# Patient Record
Sex: Female | Born: 1940
Health system: Southern US, Community
[De-identification: ages and names within clinical notes are randomized; demographics above are authoritative.]

## PROBLEM LIST (undated history)

## (undated) DIAGNOSIS — I10 Essential (primary) hypertension: Secondary | ICD-10-CM

## (undated) DIAGNOSIS — E119 Type 2 diabetes mellitus without complications: Secondary | ICD-10-CM

## (undated) DIAGNOSIS — K219 Gastro-esophageal reflux disease without esophagitis: Secondary | ICD-10-CM

## (undated) DIAGNOSIS — S82891A Other fracture of right lower leg, initial encounter for closed fracture: Secondary | ICD-10-CM

## (undated) DIAGNOSIS — E785 Hyperlipidemia, unspecified: Secondary | ICD-10-CM

## (undated) HISTORY — PX: CHOLECYSTECTOMY: SHX55

## (undated) HISTORY — PX: ABDOMINAL HYSTERECTOMY: SHX81

## (undated) HISTORY — PX: BREAST SURGERY: SHX581

## (undated) HISTORY — PX: COLONOSCOPY: SHX174

---

## 2016-04-10 ENCOUNTER — Other Ambulatory Visit: Payer: Self-pay | Admitting: Family Medicine

## 2016-04-10 DIAGNOSIS — Z1231 Encounter for screening mammogram for malignant neoplasm of breast: Secondary | ICD-10-CM

## 2016-05-25 ENCOUNTER — Ambulatory Visit (HOSPITAL_BASED_OUTPATIENT_CLINIC_OR_DEPARTMENT_OTHER)
Admission: RE | Admit: 2016-05-25 | Discharge: 2016-05-25 | Disposition: A | Discharge status: Home / Self Care | Payer: Medicare Other | Source: Ambulatory Visit | Attending: Family Medicine | Admitting: Family Medicine

## 2016-05-25 DIAGNOSIS — Z1231 Encounter for screening mammogram for malignant neoplasm of breast: Secondary | ICD-10-CM | POA: Diagnosis present

## 2017-04-23 ENCOUNTER — Other Ambulatory Visit: Payer: Self-pay | Admitting: Family Medicine

## 2017-04-23 DIAGNOSIS — Z1231 Encounter for screening mammogram for malignant neoplasm of breast: Secondary | ICD-10-CM

## 2017-05-31 ENCOUNTER — Ambulatory Visit (HOSPITAL_BASED_OUTPATIENT_CLINIC_OR_DEPARTMENT_OTHER)
Admission: RE | Admit: 2017-05-31 | Discharge: 2017-05-31 | Disposition: A | Discharge status: Home / Self Care | Payer: Medicare Other | Source: Ambulatory Visit | Attending: Family Medicine | Admitting: Family Medicine

## 2017-05-31 ENCOUNTER — Encounter (HOSPITAL_BASED_OUTPATIENT_CLINIC_OR_DEPARTMENT_OTHER): Payer: Self-pay | Admitting: Radiology

## 2017-05-31 DIAGNOSIS — Z1231 Encounter for screening mammogram for malignant neoplasm of breast: Secondary | ICD-10-CM | POA: Diagnosis present

## 2018-05-03 ENCOUNTER — Other Ambulatory Visit (HOSPITAL_BASED_OUTPATIENT_CLINIC_OR_DEPARTMENT_OTHER): Payer: Self-pay | Admitting: Physician Assistant

## 2018-05-03 DIAGNOSIS — Z1231 Encounter for screening mammogram for malignant neoplasm of breast: Secondary | ICD-10-CM

## 2018-06-02 ENCOUNTER — Ambulatory Visit (HOSPITAL_BASED_OUTPATIENT_CLINIC_OR_DEPARTMENT_OTHER): Payer: Medicare Other

## 2018-09-08 ENCOUNTER — Other Ambulatory Visit (HOSPITAL_BASED_OUTPATIENT_CLINIC_OR_DEPARTMENT_OTHER): Payer: Self-pay | Admitting: Family Medicine

## 2018-09-08 DIAGNOSIS — R3129 Other microscopic hematuria: Secondary | ICD-10-CM

## 2018-09-12 ENCOUNTER — Ambulatory Visit (HOSPITAL_BASED_OUTPATIENT_CLINIC_OR_DEPARTMENT_OTHER)
Admission: RE | Admit: 2018-09-12 | Discharge: 2018-09-12 | Disposition: A | Discharge status: Home / Self Care | Payer: Medicare Other | Source: Ambulatory Visit | Attending: Family Medicine | Admitting: Family Medicine

## 2018-09-12 ENCOUNTER — Other Ambulatory Visit: Payer: Self-pay

## 2018-09-12 DIAGNOSIS — R3129 Other microscopic hematuria: Secondary | ICD-10-CM | POA: Diagnosis present

## 2019-03-07 ENCOUNTER — Encounter (HOSPITAL_BASED_OUTPATIENT_CLINIC_OR_DEPARTMENT_OTHER): Payer: Self-pay | Admitting: Orthopaedic Surgery

## 2019-03-07 ENCOUNTER — Other Ambulatory Visit: Payer: Self-pay

## 2019-03-08 ENCOUNTER — Encounter (HOSPITAL_BASED_OUTPATIENT_CLINIC_OR_DEPARTMENT_OTHER)
Admission: RE | Admit: 2019-03-08 | Discharge: 2019-03-08 | Disposition: A | Discharge status: Home / Self Care | Payer: Medicare Other | Source: Ambulatory Visit | Attending: Orthopaedic Surgery | Admitting: Orthopaedic Surgery

## 2019-03-08 ENCOUNTER — Other Ambulatory Visit (HOSPITAL_COMMUNITY)
Admission: RE | Admit: 2019-03-08 | Discharge: 2019-03-08 | Disposition: A | Discharge status: Home / Self Care | Payer: Medicare Other | Source: Ambulatory Visit | Attending: Orthopaedic Surgery | Admitting: Orthopaedic Surgery

## 2019-03-08 DIAGNOSIS — Z01818 Encounter for other preprocedural examination: Secondary | ICD-10-CM | POA: Insufficient documentation

## 2019-03-08 DIAGNOSIS — Z20822 Contact with and (suspected) exposure to covid-19: Secondary | ICD-10-CM | POA: Diagnosis not present

## 2019-03-08 DIAGNOSIS — S82891B Other fracture of right lower leg, initial encounter for open fracture type I or II: Secondary | ICD-10-CM | POA: Diagnosis not present

## 2019-03-08 LAB — BASIC METABOLIC PANEL
Anion gap: 11 (ref 5–15)
BUN: 11 mg/dL (ref 8–23)
CO2: 29 mmol/L (ref 22–32)
Calcium: 9.3 mg/dL (ref 8.9–10.3)
Chloride: 91 mmol/L — ABNORMAL LOW (ref 98–111)
Creatinine, Ser: 0.79 mg/dL (ref 0.44–1.00)
GFR calc Af Amer: >60 mL/min (ref >60)
GFR calc non Af Amer: >60 mL/min (ref >60)
Glucose, Bld: 200 mg/dL — ABNORMAL HIGH (ref 70–99)
Potassium: 3.1 mmol/L — ABNORMAL LOW (ref 3.5–5.1)
Sodium: 131 mmol/L — ABNORMAL LOW (ref 135–145)

## 2019-03-08 LAB — SARS CORONAVIRUS 2 (TAT 6-24 HRS): SARS Coronavirus 2: NEGATIVE

## 2019-03-08 NOTE — Progress Notes (Signed)
EKG reviewed by Dr. Foster, will proceed with surgery as scheduled. 

## 2019-03-09 NOTE — H&P (Signed)
PREOPERATIVE H&P  Chief Complaint: RIGHT ANKLE FRACTURE  HPI: Jillian Preston is a 79 y.o. female who is scheduled for OPEN REDUCTION INTERNAL FIXATION (ORIF) RIGHT ANKLE DISTAL FIBULAR LATERAL MALLEOLUS FRACTURE.   Patient has a past medical history significant for HTN, hyperlipidemia, GERD, and DM.   Patient suffered a fall while walking her new dog on 03/06/2019. She had immediate pain in her right ankle. She was unable to wear weight onto that leg. She went to [facility] Emergency Department in [place], [place]. X-rays showed a displaced distal fibula fracture. She was placed in a posterior splint and told to follow-up with orthopedics.   Her symptoms are rated as moderate to severe, and have been worsening.  This is significantly impairing activities of daily living.    Please see clinic note for further details on this patient's care.    She has elected for surgical management.   Past Medical History:  Diagnosis Date  . Closed right ankle fracture   . Diabetes mellitus without complication (HCC)   . GERD (gastroesophageal reflux disease)   . Hyperlipidemia   . Hypertension    Past Surgical History:  Procedure Laterality Date  . ABDOMINAL HYSTERECTOMY    . BREAST SURGERY Left   . CHOLECYSTECTOMY    . COLONOSCOPY     Social History   Socioeconomic History  . Marital status: Unknown    Spouse name: Not on file  . Number of children: Not on file  . Years of education: Not on file  . Highest education level: Not on file  Occupational History  . Not on file  Tobacco Use  . Smoking status: Never Smoker  . Smokeless tobacco: Never Used  Substance and Sexual Activity  . Alcohol use: Not Currently  . Drug use: Never  . Sexual activity: Not on file  Other Topics Concern  . Not on file  Social History Narrative  . Not on file   Social Determinants of Health   Financial Resource Strain:   . Difficulty of Paying Living Expenses: Not on file  Food Insecurity:     . Worried About Programme researcher, broadcasting/film/video in the Last Year: Not on file  . Ran Out of Food in the Last Year: Not on file  Transportation Needs:   . Lack of Transportation (Medical): Not on file  . Lack of Transportation (Non-Medical): Not on file  Physical Activity:   . Days of Exercise per Week: Not on file  . Minutes of Exercise per Session: Not on file  Stress:   . Feeling of Stress : Not on file  Social Connections:   . Frequency of Communication with Friends and Family: Not on file  . Frequency of Social Gatherings with Friends and Family: Not on file  . Attends Religious Services: Not on file  . Active Member of Clubs or Organizations: Not on file  . Attends Banker Meetings: Not on file  . Marital Status: Not on file   History reviewed. No pertinent family history. No Known Allergies Prior to Admission medications   Medication Sig Start Date End Date Taking? Authorizing Provider  calcium carbonate (OS-CAL - DOSED IN MG OF ELEMENTAL CALCIUM) 1250 (500 Ca) MG tablet Take 1 tablet by mouth.   Yes [provider]  cetirizine (ZYRTEC) 10 MG chewable tablet Chew 10 mg by mouth daily.   Yes [provider]  famotidine (PEPCID) 20 MG tablet Take 20 mg by mouth 2 (two) times daily.  Yes [provider]  fenofibrate 54 MG tablet Take 54 mg by mouth daily.   Yes [provider]  ibuprofen (ADVIL) 200 MG tablet Take 600 mg by mouth every 6 (six) hours as needed.   Yes [provider]  lisinopril-hydrochlorothiazide (ZESTORETIC) 20-25 MG tablet Take 1 tablet by mouth daily.   Yes [provider]  loperamide (IMODIUM) 2 MG capsule Take by mouth as needed for diarrhea or loose stools.   Yes [provider]  metFORMIN (GLUCOPHAGE) 500 MG tablet Take 500 mg by mouth 2 (two) times daily with a meal.   Yes [provider]  Multiple Vitamins-Minerals (CENTRUM ADULTS) TABS Take by mouth.   Yes [provider]     ROS: All other systems have been reviewed and were otherwise negative with the exception of those mentioned in the HPI and as above.  Physical Exam: General: Alert, no acute distress Cardiovascular: No pedal edema Respiratory: No cyanosis, no use of accessory musculature GI: No organomegaly, abdomen is soft and non-tender Skin: No lesions in the area of chief complaint Neurologic: Sensation intact distally Psychiatric: Patient is competent for consent with normal mood and affect Lymphatic: No axillary or cervical lymphadenopathy  MUSCULOSKELETAL:  Right lower extremity: swelling about the right ankle. Tenderness over the lateral malleolus. Limited range of motion due to pain. She is able to wiggle her toes. Sensation intact dorsally. Warm well perfused foot.   Imaging: X-ray right ankle: mildly displaced oblique fracture distal fibula. Mortise appears to be stable.   Assessment: RIGHT ANKLE FRACTURE  Plan: Plan for Procedure(s): OPEN REDUCTION INTERNAL FIXATION (ORIF) RIGHT ANKLE DISTAL FIBULAR LATERAL MALLEOLUS FRACTURE  The risks benefits and alternatives were discussed with the patient including but not limited to the risks of nonoperative treatment, versus surgical intervention including infection, bleeding, nerve injury,  blood clots, cardiopulmonary complications, morbidity, mortality, among others, and they were willing to proceed.   The patient acknowledged the explanation, agreed to proceed with the plan and consent was signed.   Operative Plan: ORIF right distal fibula fracture Discharge Medications: Tylenol, Celebrex, Oxycodone, Zofran DVT Prophylaxis: Aspirin   Ethelda Chick, PA-C  03/09/2019 6:55 PM

## 2019-03-10 ENCOUNTER — Ambulatory Visit (HOSPITAL_BASED_OUTPATIENT_CLINIC_OR_DEPARTMENT_OTHER): Payer: Medicare Other | Admitting: Certified Registered"

## 2019-03-10 ENCOUNTER — Encounter (HOSPITAL_BASED_OUTPATIENT_CLINIC_OR_DEPARTMENT_OTHER)
Admission: RE | Disposition: A | Discharge status: Home / Self Care | Payer: Self-pay | Source: Home / Self Care | Attending: Orthopaedic Surgery

## 2019-03-10 ENCOUNTER — Other Ambulatory Visit: Payer: Self-pay

## 2019-03-10 ENCOUNTER — Ambulatory Visit (HOSPITAL_BASED_OUTPATIENT_CLINIC_OR_DEPARTMENT_OTHER)
Admission: RE | Admit: 2019-03-10 | Discharge: 2019-03-10 | Disposition: A | Discharge status: Home / Self Care | Payer: Medicare Other | Attending: Orthopaedic Surgery | Admitting: Orthopaedic Surgery

## 2019-03-10 ENCOUNTER — Encounter (HOSPITAL_BASED_OUTPATIENT_CLINIC_OR_DEPARTMENT_OTHER): Payer: Self-pay | Admitting: Orthopaedic Surgery

## 2019-03-10 DIAGNOSIS — Y93K1 Activity, walking an animal: Secondary | ICD-10-CM | POA: Insufficient documentation

## 2019-03-10 DIAGNOSIS — I10 Essential (primary) hypertension: Secondary | ICD-10-CM | POA: Insufficient documentation

## 2019-03-10 DIAGNOSIS — K219 Gastro-esophageal reflux disease without esophagitis: Secondary | ICD-10-CM | POA: Diagnosis not present

## 2019-03-10 DIAGNOSIS — S82831A Other fracture of upper and lower end of right fibula, initial encounter for closed fracture: Secondary | ICD-10-CM | POA: Diagnosis not present

## 2019-03-10 DIAGNOSIS — E119 Type 2 diabetes mellitus without complications: Secondary | ICD-10-CM | POA: Diagnosis not present

## 2019-03-10 DIAGNOSIS — Z7984 Long term (current) use of oral hypoglycemic drugs: Secondary | ICD-10-CM | POA: Diagnosis not present

## 2019-03-10 DIAGNOSIS — W19XXXA Unspecified fall, initial encounter: Secondary | ICD-10-CM | POA: Insufficient documentation

## 2019-03-10 HISTORY — DX: Type 2 diabetes mellitus without complications: E11.9

## 2019-03-10 HISTORY — DX: Gastro-esophageal reflux disease without esophagitis: K21.9

## 2019-03-10 HISTORY — PX: ORIF ANKLE FRACTURE: SHX5408

## 2019-03-10 HISTORY — DX: Hyperlipidemia, unspecified: E78.5

## 2019-03-10 HISTORY — DX: Essential (primary) hypertension: I10

## 2019-03-10 HISTORY — DX: Other fracture of right lower leg, initial encounter for closed fracture: S82.891A

## 2019-03-10 LAB — GLUCOSE, CAPILLARY
Glucose-Capillary: 217 mg/dL — ABNORMAL HIGH (ref 70–99)
Glucose-Capillary: 271 mg/dL — ABNORMAL HIGH (ref 70–99)

## 2019-03-10 SURGERY — OPEN REDUCTION INTERNAL FIXATION (ORIF) ANKLE FRACTURE
Anesthesia: General | Site: Ankle | Laterality: Right

## 2019-03-10 MED ORDER — OXYCODONE HCL 5 MG/5ML PO SOLN: 5.0000 mg | Freq: Once | ORAL | Status: DC | PRN

## 2019-03-10 MED ORDER — ACETAMINOPHEN 500 MG PO TABS
ORAL_TABLET | ORAL | Status: AC
  Filled 2019-03-10: qty 2

## 2019-03-10 MED ORDER — FENTANYL CITRATE (PF) 100 MCG/2ML IJ SOLN: 25.0000 ug | INTRAMUSCULAR | Status: DC | PRN

## 2019-03-10 MED ORDER — CHLORHEXIDINE GLUCONATE 4 % EX LIQD: 60.0000 mL | Freq: Once | CUTANEOUS | Status: DC

## 2019-03-10 MED ORDER — ASPIRIN 81 MG PO CHEW: 81.0000 mg | CHEWABLE_TABLET | Freq: Two times a day (BID) | ORAL | 1 refills | Status: AC

## 2019-03-10 MED ORDER — FENTANYL CITRATE (PF) 100 MCG/2ML IJ SOLN
50.0000 ug | INTRAMUSCULAR | Status: DC | PRN
  Administered 2019-03-10: 50 ug via INTRAVENOUS

## 2019-03-10 MED ORDER — PROPOFOL 10 MG/ML IV BOLUS
INTRAVENOUS | Status: DC | PRN
  Administered 2019-03-10: 100 mg via INTRAVENOUS

## 2019-03-10 MED ORDER — ONDANSETRON HCL 4 MG PO TABS: 4.0000 mg | ORAL_TABLET | Freq: Three times a day (TID) | ORAL | 1 refills | Status: AC | PRN

## 2019-03-10 MED ORDER — ONDANSETRON HCL 4 MG/2ML IJ SOLN
INTRAMUSCULAR | Status: DC | PRN
  Administered 2019-03-10: 4 mg via INTRAVENOUS

## 2019-03-10 MED ORDER — BUPIVACAINE-EPINEPHRINE (PF) 0.5% -1:200000 IJ SOLN: INTRAMUSCULAR | Status: DC | PRN

## 2019-03-10 MED ORDER — DEXAMETHASONE SODIUM PHOSPHATE 10 MG/ML IJ SOLN
INTRAMUSCULAR | Status: DC | PRN
  Administered 2019-03-10: 4 mg via INTRAVENOUS

## 2019-03-10 MED ORDER — OXYCODONE HCL 5 MG PO TABS: ORAL_TABLET | ORAL | 0 refills | Status: AC

## 2019-03-10 MED ORDER — LIDOCAINE 2% (20 MG/ML) 5 ML SYRINGE
INTRAMUSCULAR | Status: DC | PRN
  Administered 2019-03-10: 60 mg via INTRAVENOUS

## 2019-03-10 MED ORDER — CEFAZOLIN SODIUM-DEXTROSE 2-4 GM/100ML-% IV SOLN
2.0000 g | INTRAVENOUS | Status: AC
  Administered 2019-03-10: 13:00:00 2 g via INTRAVENOUS

## 2019-03-10 MED ORDER — OXYCODONE HCL 5 MG PO TABS: 5.0000 mg | ORAL_TABLET | Freq: Once | ORAL | Status: DC | PRN

## 2019-03-10 MED ORDER — FENTANYL CITRATE (PF) 100 MCG/2ML IJ SOLN
INTRAMUSCULAR | Status: AC
  Filled 2019-03-10: qty 2

## 2019-03-10 MED ORDER — BUPIVACAINE-EPINEPHRINE (PF) 0.5% -1:200000 IJ SOLN
INTRAMUSCULAR | Status: DC | PRN
  Administered 2019-03-10: 20 mL via PERINEURAL
  Administered 2019-03-10: 10 mL via PERINEURAL

## 2019-03-10 MED ORDER — CELECOXIB 200 MG PO CAPS: 200.0000 mg | ORAL_CAPSULE | Freq: Two times a day (BID) | ORAL | 1 refills | Status: AC

## 2019-03-10 MED ORDER — VANCOMYCIN HCL 1000 MG IV SOLR
INTRAVENOUS | Status: DC | PRN
  Administered 2019-03-10: 1000 mg via TOPICAL

## 2019-03-10 MED ORDER — PROMETHAZINE HCL 25 MG/ML IJ SOLN: 6.2500 mg | INTRAMUSCULAR | Status: DC | PRN

## 2019-03-10 MED ORDER — ACETAMINOPHEN 500 MG PO TABS
1000.0000 mg | ORAL_TABLET | Freq: Once | ORAL | Status: AC
  Administered 2019-03-10: 12:00:00 1000 mg via ORAL

## 2019-03-10 MED ORDER — MIDAZOLAM HCL 2 MG/2ML IJ SOLN: 1.0000 mg | INTRAMUSCULAR | Status: DC | PRN

## 2019-03-10 MED ORDER — CEFAZOLIN SODIUM-DEXTROSE 2-4 GM/100ML-% IV SOLN
INTRAVENOUS | Status: AC
  Filled 2019-03-10: qty 100

## 2019-03-10 MED ORDER — MIDAZOLAM HCL 2 MG/2ML IJ SOLN
INTRAMUSCULAR | Status: AC
  Filled 2019-03-10: qty 2

## 2019-03-10 MED ORDER — ACETAMINOPHEN 500 MG PO TABS: 1000.0000 mg | ORAL_TABLET | Freq: Three times a day (TID) | ORAL | 0 refills | Status: AC

## 2019-03-10 MED ORDER — LIDOCAINE 2% (20 MG/ML) 5 ML SYRINGE
INTRAMUSCULAR | Status: AC
  Filled 2019-03-10: qty 10

## 2019-03-10 MED ORDER — LACTATED RINGERS IV SOLN: INTRAVENOUS | Status: DC

## 2019-03-10 MED ORDER — PROPOFOL 10 MG/ML IV BOLUS
INTRAVENOUS | Status: AC
  Filled 2019-03-10: qty 20

## 2019-03-10 SURGICAL SUPPLY — 91 items
BENZOIN TINCTURE PRP APPL 2/3 (GAUZE/BANDAGES/DRESSINGS) IMPLANT
BIT DRILL 2 CANN GRADUATED (BIT) ×3 IMPLANT
BIT DRILL 2.5 CANN LNG (BIT) ×3 IMPLANT
BIT DRILL 2.5 CANN STRL (BIT) ×3 IMPLANT
BIT DRILL 2.7 (BIT) ×2
BIT DRILL 2.7X2.7/3XSCR ANKL (BIT) ×1 IMPLANT
BIT DRL 2.7X2.7/3XSCR ANKL (BIT) ×1
BLADE SURG 10 STRL SS (BLADE) IMPLANT
BLADE SURG 15 STRL LF DISP TIS (BLADE) ×2 IMPLANT
BLADE SURG 15 STRL SS (BLADE) ×4
BNDG COHESIVE 4X5 TAN STRL (GAUZE/BANDAGES/DRESSINGS) ×3 IMPLANT
BNDG ELASTIC 4X5.8 VLCR STR LF (GAUZE/BANDAGES/DRESSINGS) ×3 IMPLANT
BNDG ELASTIC 6X5.8 VLCR STR LF (GAUZE/BANDAGES/DRESSINGS) ×3 IMPLANT
BNDG ESMARK 4X9 LF (GAUZE/BANDAGES/DRESSINGS) IMPLANT
CLEANER CAUTERY TIP 5X5 PAD (MISCELLANEOUS) IMPLANT
CLOSURE STERI-STRIP 1/2X4 (GAUZE/BANDAGES/DRESSINGS) ×1
CLSR STERI-STRIP ANTIMIC 1/2X4 (GAUZE/BANDAGES/DRESSINGS) ×2 IMPLANT
COVER BACK TABLE 60X90IN (DRAPES) ×3 IMPLANT
COVER MAYO STAND STRL (DRAPES) ×3 IMPLANT
COVER WAND RF STERILE (DRAPES) IMPLANT
CUFF TOURN SGL QUICK 24 (TOURNIQUET CUFF) ×2
CUFF TOURN SGL QUICK 34 (TOURNIQUET CUFF)
CUFF TRNQT CYL 24X4X16.5-23 (TOURNIQUET CUFF) ×1 IMPLANT
CUFF TRNQT CYL 34X4.125X (TOURNIQUET CUFF) IMPLANT
DECANTER SPIKE VIAL GLASS SM (MISCELLANEOUS) IMPLANT
DRAPE EXTREMITY T 121X128X90 (DISPOSABLE) ×3 IMPLANT
DRAPE IMP U-DRAPE 54X76 (DRAPES) ×3 IMPLANT
DRAPE OEC MINIVIEW 54X84 (DRAPES) ×3 IMPLANT
DRAPE U-SHAPE 47X51 STRL (DRAPES) ×3 IMPLANT
DRSG PAD ABDOMINAL 8X10 ST (GAUZE/BANDAGES/DRESSINGS) ×3 IMPLANT
DURAPREP 26ML APPLICATOR (WOUND CARE) ×3 IMPLANT
ELECT REM PT RETURN 9FT ADLT (ELECTROSURGICAL) ×3
ELECTRODE REM PT RTRN 9FT ADLT (ELECTROSURGICAL) ×1 IMPLANT
GAUZE SPONGE 4X4 12PLY STRL (GAUZE/BANDAGES/DRESSINGS) ×3 IMPLANT
GAUZE XEROFORM 1X8 LF (GAUZE/BANDAGES/DRESSINGS) ×3 IMPLANT
GLOVE BIO SURGEON STRL SZ 6.5 (GLOVE) ×2 IMPLANT
GLOVE BIO SURGEON STRL SZ8 (GLOVE) ×3 IMPLANT
GLOVE BIO SURGEONS STRL SZ 6.5 (GLOVE) ×1
GLOVE BIOGEL PI IND STRL 6.5 (GLOVE) ×1 IMPLANT
GLOVE BIOGEL PI IND STRL 7.0 (GLOVE) ×2 IMPLANT
GLOVE BIOGEL PI IND STRL 8 (GLOVE) ×1 IMPLANT
GLOVE BIOGEL PI INDICATOR 6.5 (GLOVE) ×2
GLOVE BIOGEL PI INDICATOR 7.0 (GLOVE) ×4
GLOVE BIOGEL PI INDICATOR 8 (GLOVE) ×2
GLOVE ECLIPSE 6.5 STRL STRAW (GLOVE) ×3 IMPLANT
GLOVE ECLIPSE 8.0 STRL XLNG CF (GLOVE) ×3 IMPLANT
GOWN STRL REUS W/ TWL LRG LVL3 (GOWN DISPOSABLE) ×1 IMPLANT
GOWN STRL REUS W/TWL LRG LVL3 (GOWN DISPOSABLE) ×2
GOWN STRL REUS W/TWL XL LVL3 (GOWN DISPOSABLE) ×3 IMPLANT
NEEDLE HYPO 22GX1.5 SAFETY (NEEDLE) IMPLANT
NS IRRIG 1000ML POUR BTL (IV SOLUTION) ×3 IMPLANT
PACK BASIN DAY SURGERY FS (CUSTOM PROCEDURE TRAY) ×3 IMPLANT
PAD CAST 4YDX4 CTTN HI CHSV (CAST SUPPLIES) ×1 IMPLANT
PAD CLEANER CAUTERY TIP 5X5 (MISCELLANEOUS)
PADDING CAST ABS 4INX4YD NS (CAST SUPPLIES) ×4
PADDING CAST ABS COTTON 4X4 ST (CAST SUPPLIES) ×2 IMPLANT
PADDING CAST COTTON 4X4 STRL (CAST SUPPLIES) ×2
PADDING CAST COTTON 6X4 STRL (CAST SUPPLIES) ×3 IMPLANT
PENCIL SMOKE EVACUATOR (MISCELLANEOUS) ×3 IMPLANT
PLATE 5HOLE LOCKING 91MML (Plate) ×3 IMPLANT
SCREW CORTICAL 2.7X18 LO PRO (Screw) ×3 IMPLANT
SCREW LO PRO 2.7X22MM CORTEX (Screw) ×3 IMPLANT
SCREW LOCK T15 FT 14X3.5XST (Screw) ×1 IMPLANT
SCREW LOCKING 2.7X10 ANKLE (Screw) ×3 IMPLANT
SCREW LOCKING 2.7X12 ANKLE (Screw) ×3 IMPLANT
SCREW LOCKING 2.7X14MM (Screw) ×9 IMPLANT
SCREW LOCKING 3.5X12MM (Screw) ×3 IMPLANT
SCREW LOCKING 3.5X14MM (Screw) ×2 IMPLANT
SCREW LOW PROFILE 3.5X16 (Screw) ×3 IMPLANT
SCREW NON LOCKING 3.5X10 (Screw) ×3 IMPLANT
SLEEVE SCD COMPRESS KNEE MED (MISCELLANEOUS) IMPLANT
SPLINT FAST PLASTER 5X30 (CAST SUPPLIES) ×40
SPLINT PLASTER CAST FAST 5X30 (CAST SUPPLIES) ×20 IMPLANT
SPONGE LAP 18X18 RF (DISPOSABLE) ×3 IMPLANT
SUCTION FRAZIER HANDLE 10FR (MISCELLANEOUS) ×2
SUCTION TUBE FRAZIER 10FR DISP (MISCELLANEOUS) ×1 IMPLANT
SUT ETHILON 2 0 FS 18 (SUTURE) ×6 IMPLANT
SUT MNCRL AB 4-0 PS2 18 (SUTURE) ×3 IMPLANT
SUT MON AB 3-0 SH 27 (SUTURE)
SUT MON AB 3-0 SH27 (SUTURE) IMPLANT
SUT VIC AB 0 CT1 27 (SUTURE) ×2
SUT VIC AB 0 CT1 27XBRD ANBCTR (SUTURE) ×1 IMPLANT
SUT VIC AB 3-0 SH 27 (SUTURE) ×2
SUT VIC AB 3-0 SH 27X BRD (SUTURE) ×1 IMPLANT
SYR BULB 3OZ (MISCELLANEOUS) ×3 IMPLANT
SYR CONTROL 10ML LL (SYRINGE) IMPLANT
TOWEL GREEN STERILE FF (TOWEL DISPOSABLE) ×6 IMPLANT
TUBE CONNECTING 20'X1/4 (TUBING) ×1
TUBE CONNECTING 20X1/4 (TUBING) ×2 IMPLANT
UNDERPAD 30X36 HEAVY ABSORB (UNDERPADS AND DIAPERS) ×6 IMPLANT
YANKAUER SUCT BULB TIP NO VENT (SUCTIONS) ×3 IMPLANT

## 2019-03-10 NOTE — Discharge Instructions (Signed)
Next dose of Tylenol at 6:30 PM  Post Anesthesia Home Care Instructions  Activity: Get plenty of rest for the remainder of the day. A responsible individual must stay with you for 24 hours following the procedure.  For the next 24 hours, DO NOT: -Drive a car -Advertising copywriter -Drink alcoholic beverages -Take any medication unless instructed by your physician -Make any legal decisions or sign important papers.  Meals: Start with liquid foods such as gelatin or soup. Progress to regular foods as tolerated. Avoid greasy, spicy, heavy foods. If nausea and/or vomiting occur, drink only clear liquids until the nausea and/or vomiting subsides. Call your physician if vomiting continues.  Special Instructions/Symptoms: Your throat may feel dry or sore from the anesthesia or the breathing tube placed in your throat during surgery. If this causes discomfort, gargle with warm salt water. The discomfort should disappear within 24 hours.  If you had a scopolamine patch placed behind your ear for the management of post- operative nausea and/or vomiting:  1. The medication in the patch is effective for 72 hours, after which it should be removed.  Wrap patch in a tissue and discard in the trash. Wash hands thoroughly with soap and water. 2. You may remove the patch earlier than 72 hours if you experience unpleasant side effects which may include dry mouth, dizziness or visual disturbances. 3. Avoid touching the patch. Wash your hands with soap and water after contact with the patch.    Regional Anesthesia Blocks  1. Numbness or the inability to move the "blocked" extremity may last from 3-48 hours after placement. The length of time depends on the medication injected and your individual response to the medication. If the numbness is not going away after 48 hours, call your surgeon.  2. The extremity that is blocked will need to be protected until the numbness is gone and the  Strength has returned.  Because you cannot feel it, you will need to take extra care to avoid injury. Because it may be weak, you may have difficulty moving it or using it. You may not know what position it is in without looking at it while the block is in effect.  3. For blocks in the legs and feet, returning to weight bearing and walking needs to be done carefully. You will need to wait until the numbness is entirely gone and the strength has returned. You should be able to move your leg and foot normally before you try and bear weight or walk. You will need someone to be with you when you first try to ensure you do not fall and possibly risk injury.  4. Bruising and tenderness at the needle site are common side effects and will resolve in a few days.  5. Persistent numbness or new problems with movement should be communicated to the surgeon or the [facility] [phone] [facility] [phone]).

## 2019-03-10 NOTE — Anesthesia Preprocedure Evaluation (Addendum)
Anesthesia Evaluation  Patient identified by MRN, date of birth, ID band Patient awake    Reviewed: Allergy & Precautions, Patient's Chart, lab work & pertinent test results  History of Anesthesia Complications Negative for: history of anesthetic complications  Airway Mallampati: III  TM Distance: <3 FB Neck ROM: Full  Mouth opening: Limited Mouth Opening  Dental no notable dental hx.    Pulmonary neg pulmonary ROS,    Pulmonary exam normal        Cardiovascular hypertension, Pt. on medications Normal cardiovascular exam     Neuro/Psych negative neurological ROS  negative psych ROS   GI/Hepatic Neg liver ROS, GERD  Controlled and Medicated,  Endo/Other  diabetes, Type 2, Oral Hypoglycemic Agents  Renal/GU negative Renal ROS  negative genitourinary   Musculoskeletal Right ankle fracture   Abdominal   Peds  Hematology negative hematology ROS (+)   Anesthesia Other Findings Day of surgery medications reviewed with patient.  Reproductive/Obstetrics negative OB ROS                            Anesthesia Physical Anesthesia Plan  ASA: II  Anesthesia Plan: General   Post-op Pain Management: GA combined w/ Regional for post-op pain   Induction: Intravenous  PONV Risk Score and Plan: 3 and Treatment may vary due to age or medical condition and Ondansetron  Airway Management Planned: LMA  Additional Equipment: None  Intra-op Plan:   Post-operative Plan: Extubation in OR  Informed Consent: I have reviewed the patients History and Physical, chart, labs and discussed the procedure including the risks, benefits and alternatives for the proposed anesthesia with the patient or authorized representative who has indicated his/her understanding and acceptance.     Dental advisory given  Plan Discussed with: CRNA  Anesthesia Plan Comments:         Anesthesia Quick Evaluation

## 2019-03-10 NOTE — Anesthesia Postprocedure Evaluation (Signed)
Anesthesia Post Note  Patient: Harlo Jaso  Procedure(s) Performed: OPEN REDUCTION INTERNAL FIXATION (ORIF) RIGHT ANKLE DISTAL FIBULAR LATERAL MALLEOLUS FRACTURE (Right Ankle)     Patient location during evaluation: PACU Anesthesia Type: General Level of consciousness: awake and alert and oriented Pain management: pain level controlled Vital Signs Assessment: post-procedure vital signs reviewed and stable Respiratory status: spontaneous breathing, nonlabored ventilation and respiratory function stable Cardiovascular status: blood pressure returned to baseline Postop Assessment: no apparent nausea or vomiting Anesthetic complications: no    Last Vitals:  Vitals:   03/10/19 1454 03/10/19 1500  BP:  128/72  Pulse:  91  Resp:  10  Temp:    SpO2: 97% 93%    Last Pain:  Vitals:   03/10/19 1500  TempSrc:   PainSc: 0-No pain    LLE Motor Response: Purposeful movement (03/10/19 1500) LLE Sensation: No sensation (absent) (03/10/19 1500)          Kaylyn Layer

## 2019-03-10 NOTE — Interval H&P Note (Signed)
History and Physical Interval Note:  03/10/2019 12:26 PM  Jillian Preston  has presented today for surgery, with the diagnosis of RIGHT ANKLE FRACTURE.  The various methods of treatment have been discussed with the patient and family. After consideration of risks, benefits and other options for treatment, the patient has consented to  Procedure(s): OPEN REDUCTION INTERNAL FIXATION (ORIF) RIGHT ANKLE DISTAL FIBULAR LATERAL MALLEOLUS FRACTURE (Right) as a surgical intervention.  The patient's history has been reviewed, patient examined, no change in status, stable for surgery.  I have reviewed the patient's chart and labs.  Questions were answered to the patient's satisfaction.     Bjorn Pippin

## 2019-03-10 NOTE — Anesthesia Procedure Notes (Signed)
Anesthesia Regional Block: Popliteal block   Pre-Anesthetic Checklist: ,, timeout performed, Correct Patient, Correct Site, Correct Laterality, Correct Procedure, Correct Position, site marked, Risks and benefits discussed, pre-op evaluation,  At surgeon's request and post-op pain management  Laterality: Right  Prep: Maximum Sterile Barrier Precautions used, chloraprep       Needles:  Injection technique: Single-shot  Needle Type: Echogenic Stimulator Needle     Needle Length: 9cm  Needle Gauge: 22     Additional Needles:   Procedures:,,,, ultrasound used (permanent image in chart),,,,  Narrative:  Start time: 03/10/2019 12:12 PM End time: 03/10/2019 12:14 PM Injection made incrementally with aspirations every 5 mL.  Performed by: Personally  Anesthesiologist: Kaylyn Layer, MD  Additional Notes: Risks, benefits, and alternative discussed. Patient gave consent for procedure. Patient prepped and draped in sterile fashion. Sedation administered, patient remains easily responsive to voice. Relevant anatomy identified with ultrasound guidance. Local anesthetic given in 5cc increments with no signs or symptoms of intravascular injection. No pain or paraesthesias with injection. Patient monitored throughout procedure with signs of LAST or immediate complications. Tolerated well. Ultrasound image placed in chart.  Amalia Greenhouse, MD

## 2019-03-10 NOTE — Progress Notes (Signed)
Assisted Dr. Howze with right, ultrasound guided, popliteal, adductor canal block. Side rails up, monitors on throughout procedure. See vital signs in flow sheet. Tolerated Procedure well. 

## 2019-03-10 NOTE — Op Note (Signed)
Orthopaedic Surgery Operative Note (CSN: [id number])  Jillian Preston  01-Sep-1940 Date of Surgery: 03/10/2019   Diagnoses:  RIGHT distal fibula fracture  Procedure: ORIF right distal fibula   Operative Finding Successful completion of the planned procedure.  Patient bone quality was exceptionally poor used robust fixation filling her plate completely.  Prior to fixation helped with our alignment but was very poor quality purchase.  We was very happy with the final product.  Post-operative plan: The patient will be nonweightbearing in a splint for 1 week and transition to a cast for 3 weeks with sutures to be removed at 4 weeks postop.  The patient will be discharged home.  DVT prophylaxis Aspirin 81 mg twice daily for 6 weeks.   Pain control with PRN pain medication preferring oral medicines.  Follow up plan will be scheduled in approximately 7 days for incision check and XR.  Post-Op Diagnosis: Same Surgeons:Primary: Bjorn Pippin, MD Assistants:Caroline McBane PA-C Location: [facility] OR ROOM 5 Anesthesia: General with regional anesthesia Antibiotics: Ancef 2 g with local vancomycin powder 1 g at the surgical site Tourniquet time:  Total Tourniquet Time Documented: Calf (Right) - 46 minutes Total: Calf (Right) - 46 minutes  Estimated Blood Loss: Minimal Complications: None Specimens: None Implants: Implant Name Type Inv. Item Serial No. Manufacturer Lot No. LRB No. Used Action  PLATE 5HOLE LOCKING - [id number] Plate PLATE 5HOLE LOCKING  [employer] STERILIZED ON SET Right 1 Implanted  SCREW LOW PROFILE 3.5X16 - [id number] Screw SCREW LOW PROFILE 3.5X16  [employer] STERILIZED ON SET Right 1 Implanted  SCREW LOCKING 2.7X14MM - [id number] Screw SCREW LOCKING 2.7X14MM  [employer] STERILIZED ON SET Right 3 Implanted  SCREW LOCKING 2.7X10 ANKLE - [id number] Screw SCREW LOCKING 2.7X10 ANKLE  [employer] STERILIZED ON SET Right 1 Implanted  SCREW LOCKING 2.7X12 ANKLE - [id number] Screw  SCREW LOCKING 2.7X12 ANKLE  [employer] STERILIZED ON SET Right 1 Implanted  SCREW LOCKING 3.5X12MM - [id number] Screw SCREW LOCKING 3.5X12MM  [employer] STERILIZED ON SET Right 1 Implanted  SCREW LOCKING 3.5X14MM - [id number] Screw SCREW LOCKING 3.5X14MM  [employer] STERILIZED ON SET Right 1 Implanted  SCREW NON LOCKING 3.5X10 - [id number] Screw SCREW NON LOCKING 3.5X10  [employer] STERILIZED ON SET Right 1 Implanted  SCREW CORTICAL 2.7X18 LO PRO - [id number] Screw SCREW CORTICAL 2.7X18 LO PRO  [employer] STERILIZED ON SET Right 1 Implanted    Indications for Surgery:   Jillian Preston is a 79 y.o. female with fall resulting in a displaced distal fibula fracture.  Patient's diabetic status as well as her displaced fracture we worried that nonoperative management would likely lead to poor healing and the possibility of wounds with cast placement.  Benefits and risks of operative and nonoperative management were discussed prior to surgery with patient/guardian(s) and informed consent form was completed.  Specific risks including infection, need for additional surgery, infection, wound healing complications, nonunion, malunion.   Procedure:   The patient was identified properly. Informed consent was obtained and the surgical site was marked. The patient was taken up to suite where general anesthesia was induced.  The patient was positioned supine on a regular bed with a bump under the ipsilateral hip..  The right ankle was prepped and draped in the usual sterile fashion.  Timeout was performed before the beginning of the case.  Tourniquet was used for the above duration.  We began with a longitudinal approach to the fibula.  Went to skin  sharply achieving hemostasis we progressed.  We attended her incision over the fracture site using fluoroscopic guidance.  At that point we encountered the fracture site itself.  We performed periosteal elevation of the fracture site and cleared fracture hematoma at  length.  The fracture was amenable to lag screw fixation from a posterior to anterior direction.  We were able to hold the fracture reduced with a point-to-point reduction clamp and place a 2.7 mm Arthrex lag screw by technique from posterior to anterior achieving reasonable purchase.  This was much less robust than we would expect in a younger patient.  At that point we felt that distal fibular locking plate would be appropriate.  We placed our plate which was a 5 hole Arthrex plate over the fracture site.  We are able to achieve purchase with all screws and placed 5 distal locking screws and a combination of locking and nonlocking screws proximal to the fracture.  We obtained orthogonal fluoroscopic images demonstrated anatomic alignment of the fracture and no syndesmosis widening on external rotation stress testing.  Irrigated copiously and placed local antibiotics.  We carefully closed the soft tissues using nylon suture and the final closure.  Sterile dressing and well-padded well molded short leg splint was placed for the patient was awoken and taken to PACU in stable condition.  Noemi Chapel, PA-C, present and scrubbed throughout the case, critical for completion in a timely fashion, and for retraction, instrumentation, closure.

## 2019-03-10 NOTE — Anesthesia Procedure Notes (Signed)
Anesthesia Regional Block: Adductor canal block   Pre-Anesthetic Checklist: ,, timeout performed, Correct Patient, Correct Site, Correct Laterality, Correct Procedure, Correct Position, site marked, Risks and benefits discussed, pre-op evaluation,  At surgeon's request and post-op pain management  Laterality: Right  Prep: Maximum Sterile Barrier Precautions used, chloraprep       Needles:  Injection technique: Single-shot  Needle Type: Echogenic Stimulator Needle     Needle Length: 9cm  Needle Gauge: 22     Additional Needles:   Procedures:,,,, ultrasound used (permanent image in chart),,,,  Narrative:  Start time: 03/10/2019 12:09 PM End time: 03/10/2019 12:11 PM Injection made incrementally with aspirations every 5 mL.  Performed by: Personally  Anesthesiologist: Kaylyn Layer, MD  Additional Notes: Risks, benefits, and alternative discussed. Patient gave consent for procedure. Patient prepped and draped in sterile fashion. Sedation administered, patient remains easily responsive to voice. Relevant anatomy identified with ultrasound guidance. Local anesthetic given in 5cc increments with no signs or symptoms of intravascular injection. No pain or paraesthesias with injection. Patient monitored throughout procedure with signs of LAST or immediate complications. Tolerated well. Ultrasound image placed in chart.  Jillian Greenhouse, MD

## 2019-03-10 NOTE — Transfer of Care (Signed)
Immediate Anesthesia Transfer of Care Note  Patient: Jillian Preston  Procedure(s) Performed: OPEN REDUCTION INTERNAL FIXATION (ORIF) RIGHT ANKLE DISTAL FIBULAR LATERAL MALLEOLUS FRACTURE (Right Ankle)  Patient Location: PACU  Anesthesia Type:General and Regional  Level of Consciousness: awake  Airway & Oxygen Therapy: Patient Spontanous Breathing and Patient connected to nasal cannula oxygen  Post-op Assessment: Report given to RN and Post -op Vital signs reviewed and stable  Post vital signs: Reviewed and stable  Last Vitals:  Vitals Value Taken Time  BP 134/75 03/10/19 1430  Temp    Pulse 91 03/10/19 1432  Resp 16 03/10/19 1432  SpO2 99 % 03/10/19 1432  Vitals shown include unvalidated device data.  Last Pain:  Vitals:   03/10/19 1134  TempSrc: Oral  PainSc: 0-No pain         Complications: No apparent anesthesia complications

## 2019-03-10 NOTE — Anesthesia Procedure Notes (Signed)
Procedure Name: LMA Insertion Date/Time: 03/10/2019 1:14 PM Performed by: Lucinda Dell, CRNA Pre-anesthesia Checklist: Patient identified, Suction available, Emergency Drugs available and Patient being monitored Patient Re-evaluated:Patient Re-evaluated prior to induction Oxygen Delivery Method: Circle system utilized Preoxygenation: Pre-oxygenation with 100% oxygen Induction Type: IV induction Ventilation: Mask ventilation without difficulty LMA: LMA inserted LMA Size: 4.0 Tube type: Oral Number of attempts: 1 Placement Confirmation: positive ETCO2 and breath sounds checked- equal and bilateral Tube secured with: Tape Dental Injury: Teeth and Oropharynx as per pre-operative assessment

## 2019-03-13 ENCOUNTER — Encounter: Payer: Self-pay | Admitting: *Deleted

## 2019-08-24 ENCOUNTER — Ambulatory Visit: Payer: Medicare Other | Admitting: Neurology

## 2019-08-28 ENCOUNTER — Ambulatory Visit: Payer: Medicare Other | Admitting: Neurology

## 2020-06-21 ENCOUNTER — Ambulatory Visit (INDEPENDENT_AMBULATORY_CARE_PROVIDER_SITE_OTHER): Payer: Medicare Other | Admitting: Internal Medicine

## 2020-06-21 ENCOUNTER — Other Ambulatory Visit: Payer: Self-pay

## 2020-06-21 ENCOUNTER — Telehealth: Payer: Self-pay | Admitting: Internal Medicine

## 2020-06-21 ENCOUNTER — Encounter: Payer: Self-pay | Admitting: Internal Medicine

## 2020-06-21 VITALS — BP 124/72 | HR 86 | Ht 60.0 in | Wt 144.5 lb

## 2020-06-21 DIAGNOSIS — E1165 Type 2 diabetes mellitus with hyperglycemia: Secondary | ICD-10-CM

## 2020-06-21 LAB — POCT GLUCOSE (DEVICE FOR HOME USE): POC Glucose: 251 mg/dl — AB (ref 70–99)

## 2020-06-21 MED ORDER — GLIPIZIDE 5 MG PO TABS: 5.0000 mg | ORAL_TABLET | Freq: Every day | ORAL | 1 refills | Status: DC

## 2020-06-21 MED ORDER — TRULICITY 1.5 MG/0.5ML ~~LOC~~ SOAJ: 1.5000 mg | SUBCUTANEOUS | 6 refills | Status: DC

## 2020-06-21 NOTE — Telephone Encounter (Signed)
Patient called with the following information: Name of Patient's Glucose Meter is Freestyle Lite by [employer]

## 2020-06-21 NOTE — Telephone Encounter (Signed)
Updated patients chart °

## 2020-06-21 NOTE — Progress Notes (Signed)
Name: Delmy Holdren  MRN/ DOB: [id number], 03-May-1940   Age/ Sex: 80 y.o., female    PCP: Lenell Antu, DO   Reason for Endocrinology Evaluation: Type 2 Diabetes Mellitus     Date of Initial Endocrinology Visit: 06/21/2020     PATIENT IDENTIFIER: Jillian Preston is a 80 y.o. female with a past medical history of T2DM and HTN . The patient presented for initial endocrinology clinic visit on 06/21/2020 for consultative assistance with her diabetes management.    HPI: Ms. Apodaca is accompanied by her spouse and daughter    Diagnosed with DM years ago  Prior Medications tried/Intolerance: Trulicity started ~5 weeks ago . Intolerant to higher doses of Metformin  Currently checking blood sugars 2 x / day Hypoglycemia episodes : no              Hemoglobin A1c has ranged from 8.8% in 2021, peaking at 12.4% in 2022. Patient required assistance for hypoglycemia: no Patient has required hospitalization within the last 1 year from hyper or hypoglycemia: no  In terms of diet, the patient eats 3 meals a day . Snacks on occasions, avoids - sugar sweetened beverages    Denies nausea and vomiting but has chronic stomach issues . Pepcid helps.   HOME DIABETES REGIMEN: Metformin 500 mg BID  Trulicity 0.75 mg weekly  ` Statin: yes ACE-I/ARB: yes    METER DOWNLOAD SUMMARY: Did not bring    DIABETIC COMPLICATIONS: Microvascular complications:   CKD   Denies: retinopathy, neuropathy   Last eye exam: Completed 03/2020  Macrovascular complications:    Denies: CAD, PVD, TIA    PAST HISTORY: Past Medical History:  Past Medical History:  Diagnosis Date  . Closed right ankle fracture   . Diabetes mellitus without complication (HCC)   . GERD (gastroesophageal reflux disease)   . Hyperlipidemia   . Hypertension    Past Surgical History:  Past Surgical History:  Procedure Laterality Date  . ABDOMINAL HYSTERECTOMY    . BREAST SURGERY Left   . CHOLECYSTECTOMY    .  COLONOSCOPY    . ORIF ANKLE FRACTURE Right 03/10/2019   Procedure: OPEN REDUCTION INTERNAL FIXATION (ORIF) RIGHT ANKLE DISTAL FIBULAR LATERAL MALLEOLUS FRACTURE;  Surgeon: Bjorn Pippin, MD;  Location: [facility];  Service: Orthopedics;  Laterality: Right;      Social History:  reports that she has never smoked. She has never used smokeless tobacco. She reports previous alcohol use. She reports that she does not use drugs.  Family History: No family history on file.   HOME MEDICATIONS: Allergies as of 06/21/2020      Reactions   Other    Other reaction(s): Unknown      Medication List       Accurate as of June 21, 2020  8:35 AM. If you have any questions, ask your nurse or doctor.        STOP taking these medications   Trulicity 0.75 MG/0.5ML Sopn Generic drug: Dulaglutide Replaced by: Trulicity 1.5 MG/0.5ML Sopn Stopped by: Scarlette Shorts, MD     TAKE these medications   atorvastatin 40 MG tablet Commonly known as: LIPITOR Take 40 mg by mouth daily.   calcium carbonate 1250 (500 Ca) MG tablet Commonly known as: OS-CAL - dosed in mg of elemental calcium Take 1 tablet by mouth.   Centrum Adults Tabs Take by mouth.   cetirizine 10 MG chewable tablet Commonly known as: ZYRTEC Chew 10 mg by mouth daily.  famotidine 20 MG tablet Commonly known as: PEPCID Take 20 mg by mouth 2 (two) times daily.   fenofibrate 54 MG tablet Take 54 mg by mouth daily.   glipiZIDE 5 MG tablet Commonly known as: GLUCOTROL Take 1 tablet (5 mg total) by mouth daily before breakfast. Started by: Scarlette Shorts, MD   lisinopril-hydrochlorothiazide 20-25 MG tablet Commonly known as: ZESTORETIC Take 1 tablet by mouth daily.   loperamide 2 MG capsule Commonly known as: IMODIUM Take by mouth as needed for diarrhea or loose stools.   metFORMIN 500 MG tablet Commonly known as: GLUCOPHAGE Take 500 mg by mouth 2 (two) times daily with a meal.   Trulicity 1.5  MG/0.5ML Sopn Generic drug: Dulaglutide Inject 1.5 mg into the skin once a week. Replaces: Trulicity 0.75 MG/0.5ML Sopn Started by: Scarlette Shorts, MD        ALLERGIES: Allergies  Allergen Reactions  . Other     Other reaction(s): Unknown     REVIEW OF SYSTEMS: A comprehensive ROS was conducted with the patient and is negative except as per HPI    OBJECTIVE:   VITAL SIGNS: BP 124/72   Pulse 86   Ht 5' (1.524 m)   Wt 144 lb 8 oz (65.5 kg)   SpO2 98%   BMI 28.22 kg/m    PHYSICAL EXAM:  General: Pt appears well and is in NAD  Neck: General: Supple without adenopathy or carotid bruits. Thyroid: Thyroid size normal.  No goiter or nodules appreciated.  Lungs: Clear with good BS bilat with no rales, rhonchi, or wheezes  Heart: RRR   Abdomen: Normoactive bowel sounds, soft, nontender, without masses or organomegaly palpable  Extremities:  Lower extremities - No pretibial edema. No lesions.  Neuro: MS is good with appropriate affect, pt is alert and Ox3    DATA REVIEWED: 01/2020 A1c 12.4% BUN/Cr 17/0.95 GFR 57 Tg 354 LDL 98   ASSESSMENT / PLAN / RECOMMENDATIONS:   1) Type 2 Diabetes Mellitus, Poorly controlled, Without complications - Most recent A1c of 13.3 %. Goal A1c < 7.5 %.   - She has been on Trulicity for ~ 6 weeks, has noted improvement in glycemic control from 400 's to 300's. Her BG today is 251 mg/dL after eating a yogurt. Fasting this morning 203 mg/dL   - We discussed ADA recommendations in starting insulin with an A1c > 10.0% but this will create hardship on the caregiver as she has been injecting mother with trulicity. We have discussed starting SU , we discussed risk of hypoglycemia and to check fasting or when feeling out of normal.  - Will also increase trulicity  - She is intolerant to higher doses of Metformin, attributes chronic stomach issues to metformin and pepcid helps.    MEDICATIONS: - Start Glipizide 5 mg, 1 tablet before  breakfast  - Continue Metformin 500 mg, 1 tablet  twice a day  - Increase trulicity 1.5 mg once weekly      EDUCATION / INSTRUCTIONS:  BG monitoring instructions: Patient is instructed to check her blood sugars 1 times day, fasting or when symptomatic   Call [facility] if: BG persistently < 70  . I reviewed the Rule of 15 for the treatment of hypoglycemia in detail with the patient. Literature supplied.   2) Diabetic complications:   Eye: Does not have known diabetic retinopathy.   Neuro/ Feet: Does not have known diabetic peripheral neuropathy.  Renal: Patient does not have known baseline CKD. She  is on an ACEI/ARB at present.  3) Dyslipidemia:  - LDL at goal . She is on Atorvastatin 40 mg daily     F/U in 3 months      Signed electronically by: Lyndle Herrlich, MD  [facility]ology  [facility] [address] [place] [place], [place] [address] Phone: [phone] FAX: [phone]   CC: Lenell Antu, DO [address] [place] [address] Phone: [phone]  Fax: [phone]    Return to Endocrinology clinic as below: No future appointments.

## 2020-06-21 NOTE — Patient Instructions (Signed)
-   Start Glipizide 5 mg, 1 tablet before breakfast  - Continue Metformin 500 mg, 1 tablet  twice a day  - Increase trulicity 1.5 mg once weekly      HOW TO TREAT LOW BLOOD SUGARS (Blood sugar LESS THAN 70 MG/DL)  Please follow the RULE OF 15 for the treatment of hypoglycemia treatment (when your (blood sugars are less than 70 mg/dL)    STEP 1: Take 15 grams of carbohydrates when your blood sugar is low, which includes:   3-4 GLUCOSE TABS  OR  3-4 OZ OF JUICE OR REGULAR SODA OR  ONE TUBE OF GLUCOSE GEL     STEP 2: RECHECK blood sugar in 15 MINUTES STEP 3: If your blood sugar is still low at the 15 minute recheck --> then, go back to STEP 1 and treat AGAIN with another 15 grams of carbohydrates.

## 2020-06-23 ENCOUNTER — Other Ambulatory Visit: Payer: Self-pay | Admitting: Internal Medicine

## 2020-09-20 ENCOUNTER — Encounter: Payer: Self-pay | Admitting: Internal Medicine

## 2020-09-20 ENCOUNTER — Ambulatory Visit (INDEPENDENT_AMBULATORY_CARE_PROVIDER_SITE_OTHER): Payer: Medicare Other | Admitting: Internal Medicine

## 2020-09-20 ENCOUNTER — Other Ambulatory Visit: Payer: Self-pay

## 2020-09-20 VITALS — BP 116/84 | HR 86 | Ht 60.0 in | Wt 145.0 lb

## 2020-09-20 DIAGNOSIS — E1165 Type 2 diabetes mellitus with hyperglycemia: Secondary | ICD-10-CM

## 2020-09-20 LAB — POCT GLYCOSYLATED HEMOGLOBIN (HGB A1C): Hemoglobin A1C: 7.5 % — AB (ref 4.0–5.6)

## 2020-09-20 NOTE — Progress Notes (Signed)
Name: Jillian Preston  Age/ Sex: 80 y.o., female   MRN/ DOB: [id number], [birth year]     PCP: Lenell Antu, DO   Reason for Endocrinology Evaluation: Type 2 Diabetes Mellitus  Initial Endocrine Consultative Visit: 06/21/2020    PATIENT IDENTIFIER: Jillian Preston is a 80 y.o. female with a past medical history of T2DM and HTN. The patient has followed with Endocrinology clinic since 06/21/2020 for consultative assistance with management of her diabetes.  DIABETIC HISTORY:  Jillian Preston was diagnosed with DM yrs ago. Her hemoglobin A1c has ranged from 8.8% in 2021, peaking at 12.4% in 2022.  Trulicity started 04/2020  On her initial visit to our clinic , her  A1c 13.3%, we continued metformin, increased Trulicity and started Glipizide  SUBJECTIVE:   During the last visit (06/21/2020): A1c 13.3%, increased Trulicity, started Glipizide and continued metformin   Today (09/20/2020): Jillian Preston is here for a follow up in diabetes management.She is accompanied by her husband and daughter today .  She checks her blood sugars 2 times daily. The patient has  had one hypoglycemic episodes early on with starting Glipizide at 69 mg/dL .     Denies recent nausea or diarrhea    HOME DIABETES REGIMEN:  Glipizide 5 mg, 1 tablet before breakfast Metformin 500 mg, 1 tablet  twice a day trulicity 1.5 mg once weekly     Statin: yes ACE-I/ARB: yes   METER DOWNLOAD SUMMARY:  69 - 200  DIABETIC COMPLICATIONS: Microvascular complications:  CKD Denies:  Last Eye Exam: Completed 03/2020  Macrovascular complications:   Denies: CAD, CVA, PVD   HISTORY:  Past Medical History:  Past Medical History:  Diagnosis Date   Closed right ankle fracture    Diabetes mellitus without complication (HCC)    GERD (gastroesophageal reflux disease)    Hyperlipidemia    Hypertension    Past Surgical History:  Past Surgical History:  Procedure Laterality Date   ABDOMINAL HYSTERECTOMY     BREAST  SURGERY Left    CHOLECYSTECTOMY     COLONOSCOPY     ORIF ANKLE FRACTURE Right 03/10/2019   Procedure: OPEN REDUCTION INTERNAL FIXATION (ORIF) RIGHT ANKLE DISTAL FIBULAR LATERAL MALLEOLUS FRACTURE;  Surgeon: Bjorn Pippin, MD;  Location: [facility];  Service: Orthopedics;  Laterality: Right;   Social History:  reports that she has never smoked. She has never used smokeless tobacco. She reports previous alcohol use. She reports that she does not use drugs. Family History: No family history on file.   HOME MEDICATIONS: Allergies as of 09/20/2020       Reactions   Other    Other reaction(s): Unknown        Medication List        Accurate as of September 20, 2020 11:02 AM. If you have any questions, ask your nurse or doctor.          atorvastatin 40 MG tablet Commonly known as: LIPITOR Take 40 mg by mouth daily.   calcium carbonate 1250 (500 Ca) MG tablet Commonly known as: OS-CAL - dosed in mg of elemental calcium Take 1 tablet by mouth.   Centrum Adults Tabs Take by mouth.   cetirizine 10 MG chewable tablet Commonly known as: ZYRTEC Chew 10 mg by mouth daily.   famotidine 20 MG tablet Commonly known as: PEPCID Take 20 mg by mouth 2 (two) times daily. Taking 20 tablet  in morning, 10 evening.   fenofibrate 54 MG tablet Take 54 mg by  mouth daily.   FreeStyle Lite Devi by Does not apply route.   FREESTYLE LITE test strip Generic drug: glucose blood Use as instructed to check blood sugar 2 times a day Dx E11.65   glipiZIDE 5 MG tablet Commonly known as: GLUCOTROL Take 1 tablet (5 mg total) by mouth daily before breakfast.   lisinopril-hydrochlorothiazide 20-25 MG tablet Commonly known as: ZESTORETIC Take 1 tablet by mouth daily.   loperamide 2 MG capsule Commonly known as: IMODIUM Take by mouth as needed for diarrhea or loose stools.   metFORMIN 500 MG tablet Commonly known as: GLUCOPHAGE Take 500 mg by mouth 2 (two) times daily with a meal.    Trulicity 1.5 MG/0.5ML Sopn Generic drug: Dulaglutide Inject 1.5 mg into the skin once a week.         OBJECTIVE:   Vital Signs: BP 116/84   Pulse 86   Ht 5' (1.524 m)   Wt 145 lb (65.8 kg)   SpO2 98%   BMI 28.32 kg/m   Wt Readings from Last 3 Encounters:  09/20/20 145 lb (65.8 kg)  06/21/20 144 lb 8 oz (65.5 kg)  03/10/19 147 lb 0.8 oz (66.7 kg)     Exam: General: Pt appears well and is in NAD  Lungs: Clear with good BS bilat with no rales, rhonchi, or wheezes  Heart: RRR   Extremities: No pretibial edema.  Neuro: MS is good with appropriate affect, pt is alert and Ox3     DATA REVIEWED: 01/2020 A1c 12.4% BUN/Cr 17/0.95 GFR 57 Tg 354 LDL 98   ASSESSMENT / PLAN / RECOMMENDATIONS:   1) Type 2 Diabetes Mellitus, Optimally controlled, Without complications - Most recent A1c of 7.5  %. Goal A1c < 7.5 %.    - A1c down from 13.3% - She is tolerating medications well , she had one episode of 69 mg/dL early on when she first started Glipizide, but non since then and it is unclear the circumstances.  She was advised to cut the glipizide dose in half is she were to be physically active that day  MEDICATIONS: - Continue  Glipizide 5 mg, 1 tablet before breakfast  - Continue Metformin 500 mg, 1 tablet  twice a day  - Trulicity 1.5 mg once weekly   EDUCATION / INSTRUCTIONS: BG monitoring instructions: Patient is instructed to check her blood sugars 2 times a day, before breakfast and supper. Call [facility] if: BG persistently < 70  I reviewed the Rule of 15 for the treatment of hypoglycemia in detail with the patient. Literature supplied.    2) Diabetic complications:  Eye: Does not have known diabetic retinopathy.  Neuro/ Feet: Does not have known diabetic peripheral neuropathy .  Renal: Patient does not have known baseline CKD. She   is  on an ACEI/ARB at present.   F/U in 4 months   Signed electronically by: Lyndle Herrlich,  MD  [facility] Endocrinology  [facility] [address] [place] [place], [place] [address] Phone: [phone] FAX: [phone]   CC: Camillo Flaming [address] [place] [address] Phone: [phone]  Fax: [phone]  Return to Endocrinology clinic as below: No future appointments.

## 2020-09-20 NOTE — Patient Instructions (Addendum)
-   Continue  Glipizide 5 mg, 1 tablet before breakfast  - Continue Metformin 500 mg, 1 tablet  twice a day  - Trulicity 1.5 mg once weekly     HOW TO TREAT LOW BLOOD SUGARS (Blood sugar LESS THAN 70 MG/DL) Please follow the RULE OF 15 for the treatment of hypoglycemia treatment (when your (blood sugars are less than 70 mg/dL)   STEP 1: Take 15 grams of carbohydrates when your blood sugar is low, which includes:  3-4 GLUCOSE TABS  OR 3-4 OZ OF JUICE OR REGULAR SODA OR ONE TUBE OF GLUCOSE GEL    STEP 2: RECHECK blood sugar in 15 MINUTES STEP 3: If your blood sugar is still low at the 15 minute recheck --> then, go back to STEP 1 and treat AGAIN with another 15 grams of carbohydrates.

## 2020-11-18 ENCOUNTER — Other Ambulatory Visit: Payer: Self-pay | Admitting: Internal Medicine

## 2021-01-15 ENCOUNTER — Other Ambulatory Visit: Payer: Self-pay | Admitting: Internal Medicine

## 2021-01-27 ENCOUNTER — Other Ambulatory Visit: Payer: Self-pay

## 2021-01-27 ENCOUNTER — Encounter: Payer: Self-pay | Admitting: Internal Medicine

## 2021-01-27 ENCOUNTER — Ambulatory Visit (INDEPENDENT_AMBULATORY_CARE_PROVIDER_SITE_OTHER): Payer: Medicare Other | Admitting: Internal Medicine

## 2021-01-27 VITALS — BP 124/70 | HR 61 | Ht 61.0 in | Wt 146.0 lb

## 2021-01-27 DIAGNOSIS — E1165 Type 2 diabetes mellitus with hyperglycemia: Secondary | ICD-10-CM | POA: Diagnosis not present

## 2021-01-27 LAB — BASIC METABOLIC PANEL
BUN: 17 mg/dL (ref 6–23)
CO2: 31 mEq/L (ref 19–32)
Calcium: 10.2 mg/dL (ref 8.4–10.5)
Chloride: 92 mEq/L — ABNORMAL LOW (ref 96–112)
Creatinine, Ser: 0.89 mg/dL (ref 0.40–1.20)
GFR: 61.39 mL/min (ref >60.00)
Glucose, Bld: 295 mg/dL — ABNORMAL HIGH (ref 70–99)
Potassium: 4 mEq/L (ref 3.5–5.1)
Sodium: 133 mEq/L — ABNORMAL LOW (ref 135–145)

## 2021-01-27 LAB — POCT GLYCOSYLATED HEMOGLOBIN (HGB A1C): Hemoglobin A1C: 9.5 % — AB (ref 4.0–5.6)

## 2021-01-27 LAB — MICROALBUMIN / CREATININE URINE RATIO
Creatinine,U: 84.5 mg/dL
Microalb Creat Ratio: 0.9 mg/g (ref 0.0–30.0)
Microalb, Ur: 0.8 mg/dL (ref 0.0–1.9)

## 2021-01-27 MED ORDER — GLIPIZIDE 5 MG PO TABS: 5.0000 mg | ORAL_TABLET | Freq: Two times a day (BID) | ORAL | 3 refills | Status: DC

## 2021-01-27 NOTE — Patient Instructions (Addendum)
-   Increase Glipizide 5 mg, 1 tablet before Breakfast and Supper  - Continue Metformin 500 mg, 1 tablet  twice a day  - Trulicity 1.5 mg once weekly     HOW TO TREAT LOW BLOOD SUGARS (Blood sugar LESS THAN 70 MG/DL) Please follow the RULE OF 15 for the treatment of hypoglycemia treatment (when your (blood sugars are less than 70 mg/dL)   STEP 1: Take 15 grams of carbohydrates when your blood sugar is low, which includes:  3-4 GLUCOSE TABS  OR 3-4 OZ OF JUICE OR REGULAR SODA OR ONE TUBE OF GLUCOSE GEL    STEP 2: RECHECK blood sugar in 15 MINUTES STEP 3: If your blood sugar is still low at the 15 minute recheck --> then, go back to STEP 1 and treat AGAIN with another 15 grams of carbohydrates.

## 2021-01-27 NOTE — Progress Notes (Signed)
Name: Jillian Preston  Age/ Sex: 80 y.o., female   MRN/ DOB: [id number], [birth year]     PCP: Lenell Antu, DO   Reason for Endocrinology Evaluation: Type 2 Diabetes Mellitus  Initial Endocrine Consultative Visit: 06/21/2020    PATIENT IDENTIFIER: Jillian Preston is a 80 y.o. female with a past medical history of T2DM and HTN. The patient has followed with Endocrinology clinic since 06/21/2020 for consultative assistance with management of her diabetes.  DIABETIC HISTORY:  [name] was diagnosed with DM yrs ago. Her hemoglobin A1c has ranged from 8.8% in 2021, peaking at 12.4% in 2022.  Trulicity started 04/2020  On her initial visit to our clinic , her  A1c 13.3%, we continued metformin, increased Trulicity and started Glipizide  SUBJECTIVE:   During the last visit (09/20/2020): A1c 7.5 %, Continued  Trulicity, Glipizide and  metformin     Today (01/27/2021): Jillian Preston is here for a follow up in diabetes management.She is accompanied by her husband and daughter Corrie Dandy )  today .  She checks her blood sugars 1 times daily. The patient has  not had  hypoglycemic episodes   Trulicity cost prohibitive  Denies nausea, but has rare diarrhea that she attributes to eating certain foods    HOME DIABETES REGIMEN:  Glipizide 5 mg, 1 tablet before breakfast Metformin 500 mg, 1 tablet  twice a day trulicity 1.5 mg once weekly     Statin: yes ACE-I/ARB: yes   METER DOWNLOAD SUMMARY: 11/14-11/28/2022  Average Number Tests/Day = 0.9 Overall Mean FS Glucose = 215 Standard Deviation = 47  BG Ranges: Low = 160 High = 283    Hypoglycemic Events/30 Days: BG < 50 = 0 Episodes of symptomatic severe hypoglycemia = 0   DIABETIC COMPLICATIONS: Microvascular complications:  CKD Denies:  Last Eye Exam: Completed 03/2020  Macrovascular complications:   Denies: CAD, CVA, PVD   HISTORY:  Past Medical History:  Past Medical History:  Diagnosis Date   Closed right  ankle fracture    Diabetes mellitus without complication (HCC)    GERD (gastroesophageal reflux disease)    Hyperlipidemia    Hypertension    Past Surgical History:  Past Surgical History:  Procedure Laterality Date   ABDOMINAL HYSTERECTOMY     BREAST SURGERY Left    CHOLECYSTECTOMY     COLONOSCOPY     ORIF ANKLE FRACTURE Right 03/10/2019   Procedure: OPEN REDUCTION INTERNAL FIXATION (ORIF) RIGHT ANKLE DISTAL FIBULAR LATERAL MALLEOLUS FRACTURE;  Surgeon: Bjorn Pippin, MD;  Location: [facility];  Service: Orthopedics;  Laterality: Right;   Social History:  reports that she has never smoked. She has never used smokeless tobacco. She reports that she does not currently use alcohol. She reports that she does not use drugs. Family History: No family history on file.   HOME MEDICATIONS: Allergies as of 01/27/2021       Reactions   Other    Other reaction(s): Unknown   Pollen Extract Other (See Comments)        Medication List        Accurate as of January 27, 2021 10:20 AM. If you have any questions, ask your nurse or doctor.          aspirin 81 MG EC tablet 1 tablet   atorvastatin 40 MG tablet Commonly known as: LIPITOR Take 40 mg by mouth daily.   Calcium 1000 + D 1000-20 MG-MCG Tabs Generic drug: Calcium Carb-Cholecalciferol   calcium carbonate 1250 (  500 Ca) MG tablet Commonly known as: OS-CAL - dosed in mg of elemental calcium Take 1 tablet by mouth.   Centrum Adults Tabs Take by mouth.   cetirizine 10 MG chewable tablet Commonly known as: ZYRTEC Chew 10 mg by mouth daily.   Cranberry 450 MG Tabs 1 capsule   famotidine 20 MG tablet Commonly known as: PEPCID Take 20 mg by mouth 2 (two) times daily. Taking 20 tablet  in morning, 10 evening.   fenofibrate 54 MG tablet Take 54 mg by mouth daily.   FreeStyle Lite Devi by Does not apply route.   FREESTYLE LITE test strip Generic drug: glucose blood Use as instructed to check blood  sugar 2 times a day Dx E11.65   glipiZIDE 5 MG tablet Commonly known as: GLUCOTROL TAKE 1 TABLET BY MOUTH DAILY BEFORE BREAKFAST.   lisinopril-hydrochlorothiazide 20-25 MG tablet Commonly known as: ZESTORETIC Take 1 tablet by mouth daily.   loperamide 2 MG capsule Commonly known as: IMODIUM Take by mouth as needed for diarrhea or loose stools.   metFORMIN 500 MG tablet Commonly known as: GLUCOPHAGE Take 500 mg by mouth 2 (two) times daily with a meal.   Trulicity 1.5 MG/0.5ML Sopn Generic drug: Dulaglutide INJECT 1.5 MG INTO THE SKIN ONCE A WEEK.         OBJECTIVE:   Vital Signs: BP 124/70 (BP Location: Left Arm, Patient Position: Sitting, Cuff Size: Small)   Pulse 61   Ht 5\' 1"  (1.549 m)   Wt 146 lb (66.2 kg)   SpO2 99%   BMI 27.59 kg/m   Wt Readings from Last 3 Encounters:  01/27/21 146 lb (66.2 kg)  09/20/20 145 lb (65.8 kg)  06/21/20 144 lb 8 oz (65.5 kg)     Exam: General: Pt appears well and is in NAD  Lungs: Clear with good BS bilat with no rales, rhonchi, or wheezes  Heart: RRR   Extremities: No pretibial edema.  Neuro: MS is good with appropriate affect, pt is alert and Ox3     DATA REVIEWED:  Latest Reference Range & Units 01/27/21 10:40  Sodium 135 - 145 mEq/L 133 (L)  Potassium 3.5 - 5.1 mEq/L 4.0  Chloride 96 - 112 mEq/L 92 (L)  CO2 19 - 32 mEq/L 31  Glucose 70 - 99 mg/dL 01/29/21 (H)  BUN 6 - 23 mg/dL 17  Creatinine 732 - 2.02 mg/dL 5.42  Calcium 8.4 - 7.06 mg/dL 23.7  GFR 62.8 mL/min 61.39  MICROALB/CREAT RATIO 0.0 - 30.0 mg/g 0.9    Latest Reference Range & Units 01/27/21 10:40  Creatinine,U mg/dL 01/29/21  Microalb, Ur 0.0 - 1.9 mg/dL 0.8  MICROALB/CREAT RATIO 0.0 - 30.0 mg/g 0.9    ASSESSMENT / PLAN / RECOMMENDATIONS:   1) Type 2 Diabetes Mellitus, Poorly  controlled, Without complications - Most recent A1c of 9.5 %. Goal A1c < 7.5 %.    - A1c up from 7.5% , pt admits to dietary indiscretions  - Counseled about the importance of  avoiding sweets and limiting CHO intake  - Will increase Glipizide as below  - She was provided with pt assistance forms for Trulicity due to $200 cost /month   MEDICATIONS: - Increase  Glipizide 5 mg, 1 tablet BID - Continue Metformin 500 mg, 1 tablet  twice a day  - Continue Trulicity 1.5 mg once weekly   EDUCATION / INSTRUCTIONS: BG monitoring instructions: Patient is instructed to check her blood sugars 2 times a day, before breakfast  and supper. Call [facility] if: BG persistently < 70  I reviewed the Rule of 15 for the treatment of hypoglycemia in detail with the patient. Literature supplied.    2) Diabetic complications:  Eye: Does not have known diabetic retinopathy.  Neuro/ Feet: Does not have known diabetic peripheral neuropathy .  Renal: Patient does not have known baseline CKD. She   is  on an ACEI/ARB at present.      F/U in 4 months   Signed electronically by: Lyndle Herrlich, MD  [facility] Endocrinology  [facility] [address] [place] [place], [place] [address] Phone: [phone] FAX: [phone]   CC: Camillo Flaming [address] [place] [address] Phone: [phone]  Fax: [phone]  Return to Endocrinology clinic as below: No future appointments.

## 2021-02-19 ENCOUNTER — Other Ambulatory Visit: Payer: Self-pay | Admitting: Internal Medicine

## 2021-02-20 IMAGING — US US RENAL
1 series · 14 of 25 positions shown · non-contrast
Comparison: None.

CLINICAL DATA: Microscopic hematuria

EXAM:
RENAL / URINARY TRACT ULTRASOUND COMPLETE

[Series 1: us renal · 14 of 47 slices shown]
[im 1/47]
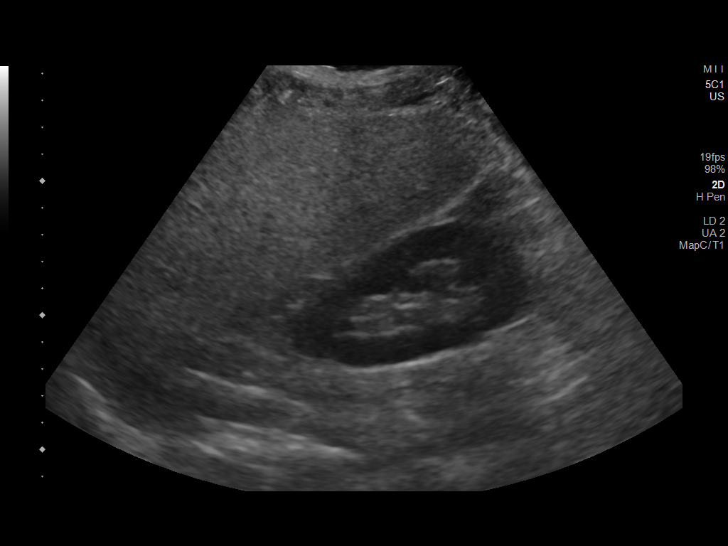
[im 4/47]
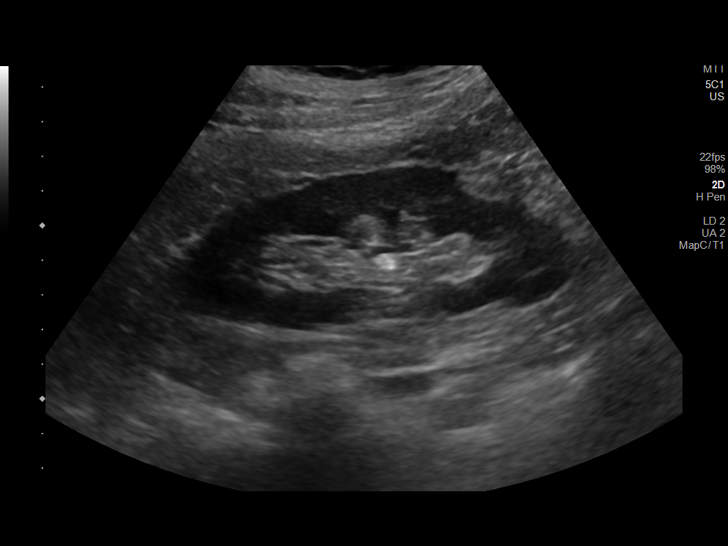
[im 8/47]
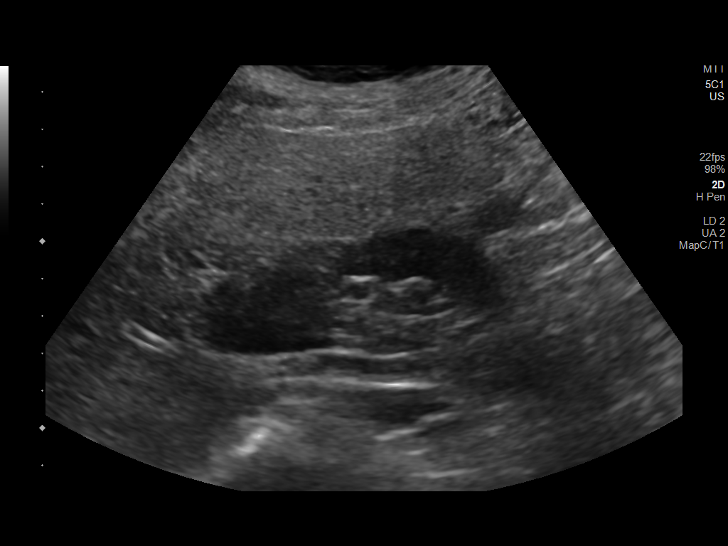
[im 12/47]
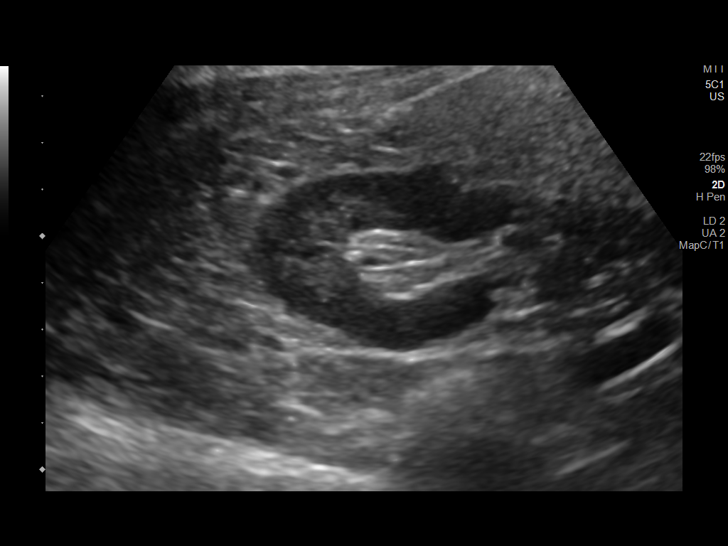
[im 16/47]
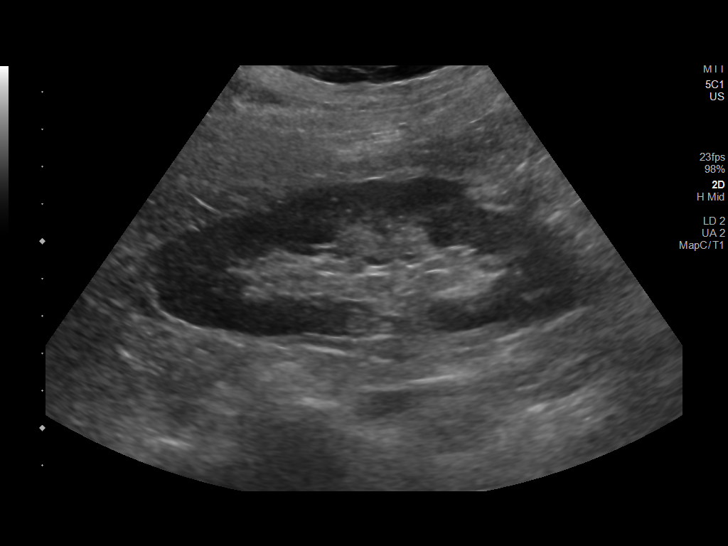
[im 18/47]
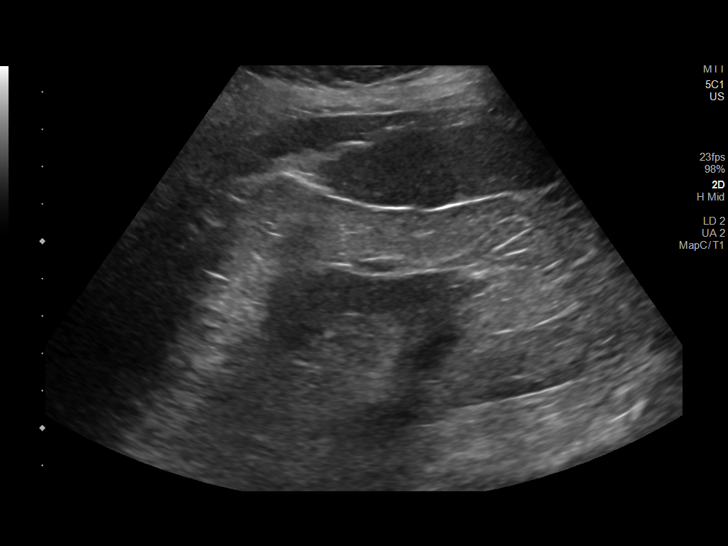
[im 22/47]
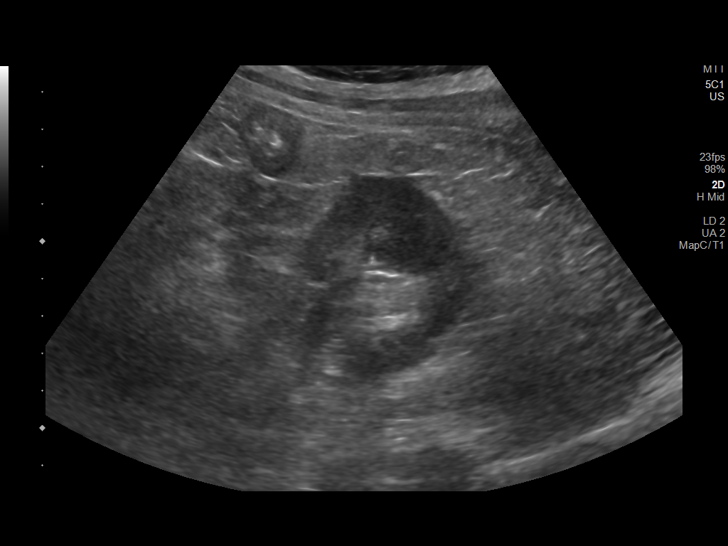
[im 25/47]
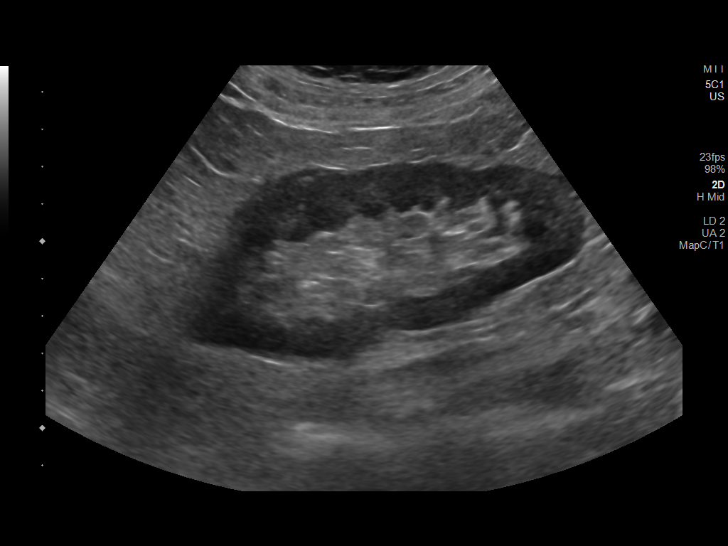
[im 29/47]
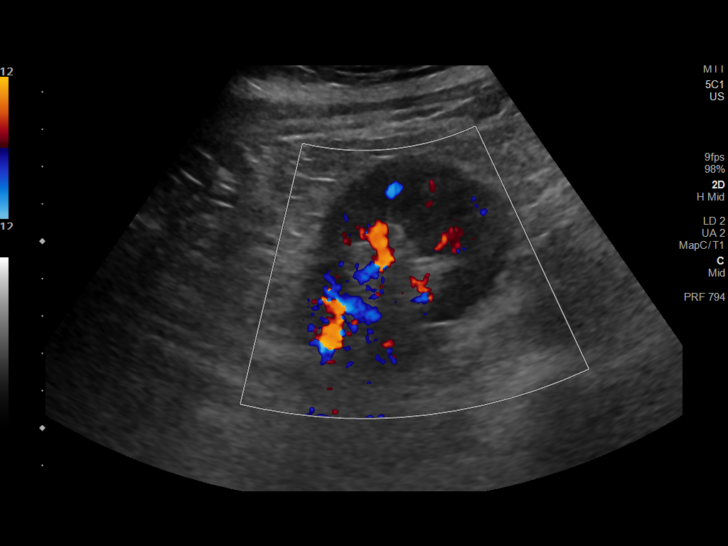
[im 31/47]
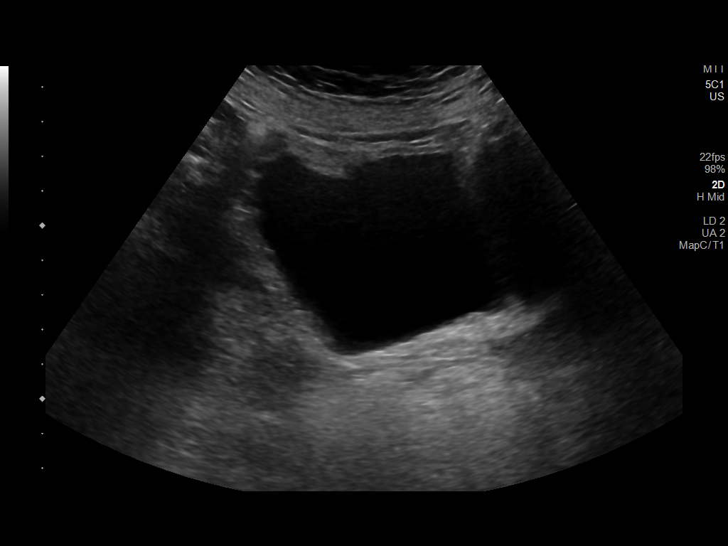
[im 35/47]
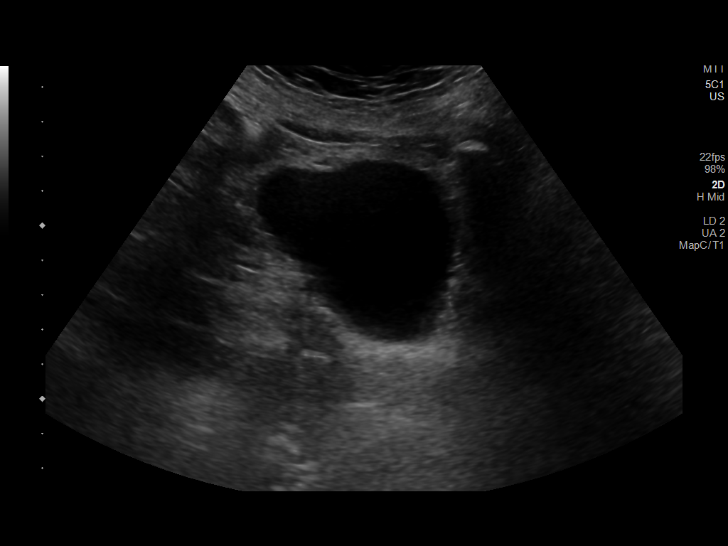
[im 39/47]
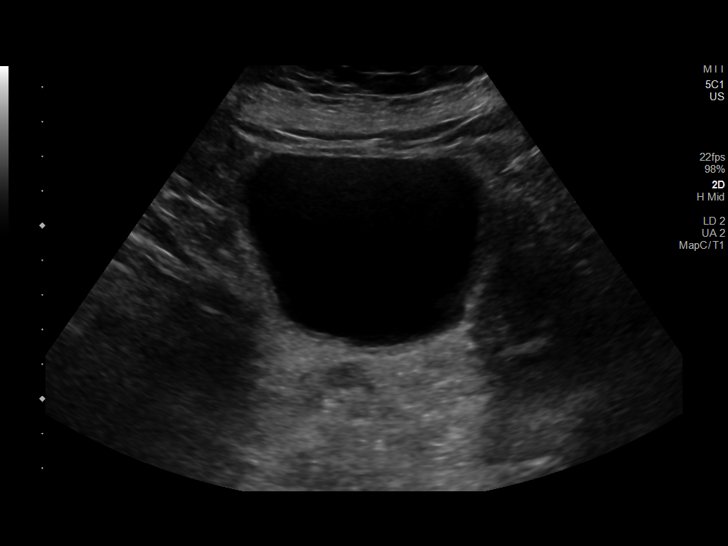
[im 43/47]
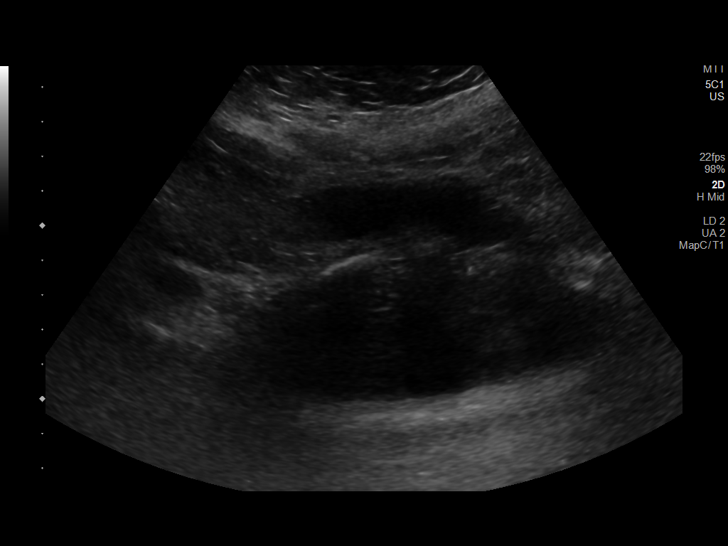
[im 47/47]
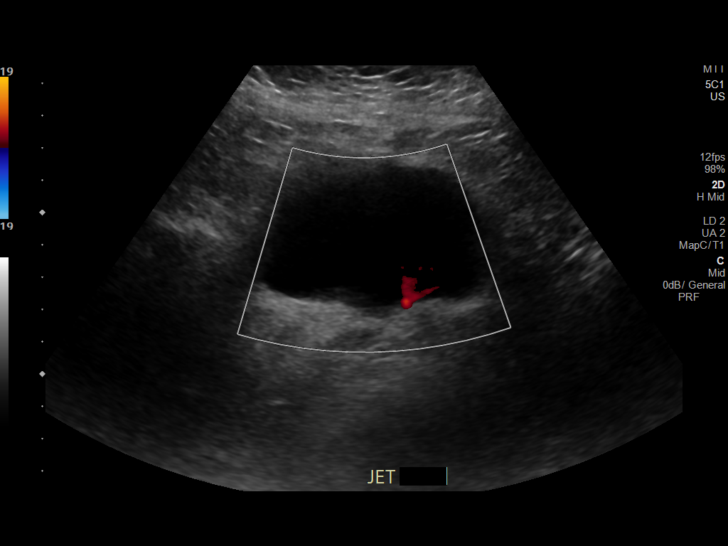

[14 of 25 positions shown; findings below may reference images not displayed]

FINDINGS: Right Kidney:

Renal measurements: 11.9 x 4.5 x 6.1 cm. = volume: 171 mL .
Echogenicity within normal limits. No mass or hydronephrosis
visualized.

Left Kidney:

Renal measurements: 10.8 x 4.8 x 4.8 cm. = volume: 133 mL.
Echogenicity within normal limits. No mass or hydronephrosis
visualized.

Bladder:

Appears normal for degree of bladder distention.

Note is made of increased echogenicity within the liver likely
related to fatty infiltration.
IMPRESSION: Normal appearing kidneys bilaterally.

Likely fatty infiltration of the liver.

## 2021-05-28 ENCOUNTER — Other Ambulatory Visit: Payer: Self-pay

## 2021-05-28 ENCOUNTER — Encounter: Payer: Self-pay | Admitting: Internal Medicine

## 2021-05-28 ENCOUNTER — Ambulatory Visit (INDEPENDENT_AMBULATORY_CARE_PROVIDER_SITE_OTHER): Payer: Medicare Other | Admitting: Internal Medicine

## 2021-05-28 VITALS — BP 124/76 | HR 92 | Ht 61.0 in | Wt 145.2 lb

## 2021-05-28 DIAGNOSIS — E1165 Type 2 diabetes mellitus with hyperglycemia: Secondary | ICD-10-CM | POA: Diagnosis not present

## 2021-05-28 LAB — POCT GLYCOSYLATED HEMOGLOBIN (HGB A1C): Hemoglobin A1C: 8.7 % — AB (ref 4.0–5.6)

## 2021-05-28 MED ORDER — TRULICITY 3 MG/0.5ML ~~LOC~~ SOAJ: 3.0000 mg | SUBCUTANEOUS | 3 refills | Status: DC

## 2021-05-28 MED ORDER — METFORMIN HCL ER 500 MG PO TB24: 500.0000 mg | ORAL_TABLET | Freq: Two times a day (BID) | ORAL | 3 refills | Status: DC

## 2021-05-28 MED ORDER — GLIPIZIDE 5 MG PO TABS: 5.0000 mg | ORAL_TABLET | Freq: Two times a day (BID) | ORAL | 3 refills | Status: DC

## 2021-05-28 NOTE — Patient Instructions (Addendum)
-   Continue  Glipizide 5 mg, 1 tablet before Breakfast and Supper  ?- Continue Metformin 500 mg, 1 tablet  twice a day  ?- Increase Trulicity 3 mg once weekly  ? ? ? ?HOW TO TREAT LOW BLOOD SUGARS (Blood sugar LESS THAN 70 MG/DL) ?Please follow the RULE OF 15 for the treatment of hypoglycemia treatment (when your (blood sugars are less than 70 mg/dL)  ? ?STEP 1: Take 15 grams of carbohydrates when your blood sugar is low, which includes:  ?3-4 GLUCOSE TABS  OR ?3-4 OZ OF JUICE OR REGULAR SODA OR ?ONE TUBE OF GLUCOSE GEL   ? ?STEP 2: RECHECK blood sugar in 15 MINUTES ?STEP 3: If your blood sugar is still low at the 15 minute recheck --> then, go back to STEP 1 and treat AGAIN with another 15 grams of carbohydrates. ? ?

## 2021-05-28 NOTE — Progress Notes (Signed)
?Name: Jillian Preston  ?Age/ Sex: 81 y.o., female   ?MRN/ DOB: [id number], 03/25/1940    ? ?PCP: Lenell AntuLe, Thao P, DO   ?Reason for Endocrinology Evaluation: Type 2 Diabetes Mellitus  ?Initial Endocrine Consultative Visit: 06/21/2020  ? ? ?PATIENT IDENTIFIER: Jillian Preston is a 81 y.o. female with a past medical history of T2DM and HTN. The patient has followed with Endocrinology clinic since 06/21/2020 for consultative assistance with management of her diabetes. ? ?DIABETIC HISTORY:  ?Jillian Preston was diagnosed with DM yrs ago. Her hemoglobin A1c has ranged from 8.8% in 2021, peaking at 12.4% in 2022. ? ?Trulicity started 04/2020 ? ?On her initial visit to our clinic , her  A1c 13.3%, we continued metformin, increased Trulicity and started Glipizide ? ?SUBJECTIVE:  ? ?During the last visit (01/27/2021): A1c 9.5%, Continued  Trulicity, and  metformin we increased glipizide ? ? ? ?Today (05/28/2021): Jillian Preston is here for a follow up in diabetes management.She is accompanied by her husband and daughter Corrie Dandy(Mary )  today .  She checks her blood sugars 1 -2times daily. The patient has  not had  hypoglycemic episodes  ? ? ? ?HOME DIABETES REGIMEN:  ?Glipizide 5 mg, 1 tablet twice daily ?Metformin 500 mg, 1 tablet  twice a day ?Trulicity 1.5 mg once weekly  ?  ? ? ?Statin: yes ?ACE-I/ARB: yes ? ? ?METER DOWNLOAD SUMMARY: unable to download  ? ?DIABETIC COMPLICATIONS: ?Microvascular complications:  ?CKD ?Denies:  ?Last Eye Exam: Completed 03/2021 ? ?Macrovascular complications:  ? ?Denies: CAD, CVA, PVD ? ? ?HISTORY:  ?Past Medical History:  ?Past Medical History:  ?Diagnosis Date  ? Closed right ankle fracture   ? Diabetes mellitus without complication (HCC)   ? GERD (gastroesophageal reflux disease)   ? Hyperlipidemia   ? Hypertension   ? ?Past Surgical History:  ?Past Surgical History:  ?Procedure Laterality Date  ? ABDOMINAL HYSTERECTOMY    ? BREAST SURGERY Left   ? CHOLECYSTECTOMY    ? COLONOSCOPY    ? ORIF ANKLE  FRACTURE Right 03/10/2019  ? Procedure: OPEN REDUCTION INTERNAL FIXATION (ORIF) RIGHT ANKLE DISTAL FIBULAR LATERAL MALLEOLUS FRACTURE;  Surgeon: Bjorn PippinVarkey, Dax T, MD;  Location: [facility];  Service: Orthopedics;  Laterality: Right;  ? ?Social History:  reports that she has never smoked. She has never used smokeless tobacco. She reports that she does not currently use alcohol. She reports that she does not use drugs. ?Family History: No family history on file. ? ? ?HOME MEDICATIONS: ?Allergies as of 05/28/2021   ? ?   Reactions  ? Other   ? Other reaction(s): Unknown  ? Pollen Extract Other (See Comments)  ? ?  ? ?  ?Medication List  ?  ? ?  ? Accurate as of May 28, 2021  7:29 AM. If you have any questions, ask your nurse or doctor.  ?  ?  ? ?  ? ?aspirin 81 MG EC tablet ?1 tablet ?  ?atorvastatin 40 MG tablet ?Commonly known as: LIPITOR ?Take 40 mg by mouth daily. ?  ?Calcium 1000 + D 1000-20 MG-MCG Tabs ?Generic drug: Calcium Carb-Cholecalciferol ?  ?calcium carbonate 1250 (500 Ca) MG tablet ?Commonly known as: OS-CAL - dosed in mg of elemental calcium ?Take 1 tablet by mouth. ?  ?Centrum Adults Tabs ?Take by mouth. ?  ?cetirizine 10 MG chewable tablet ?Commonly known as: ZYRTEC ?Chew 10 mg by mouth daily. ?  ?Cranberry 450 MG Tabs ?1 capsule ?  ?famotidine 20  MG tablet ?Commonly known as: PEPCID ?Take 20 mg by mouth 2 (two) times daily. Taking 20 tablet  in morning, 10 evening. ?  ?fenofibrate 54 MG tablet ?Take 54 mg by mouth daily. ?  ?FreeStyle Lite Devi ?by Does not apply route. ?  ?FREESTYLE LITE test strip ?Generic drug: glucose blood ?Use as instructed to check blood sugar 2 times a day Dx E11.65 ?  ?glipiZIDE 5 MG tablet ?Commonly known as: GLUCOTROL ?Take 1 tablet (5 mg total) by mouth 2 (two) times daily before a meal. ?  ?lisinopril-hydrochlorothiazide 20-25 MG tablet ?Commonly known as: ZESTORETIC ?Take 1 tablet by mouth daily. ?  ?loperamide 2 MG capsule ?Commonly known as: IMODIUM ?Take  by mouth as needed for diarrhea or loose stools. ?  ?metFORMIN 500 MG tablet ?Commonly known as: GLUCOPHAGE ?Take 500 mg by mouth 2 (two) times daily with a meal. ?  ?Trulicity 1.5 MG/0.5ML Sopn ?Generic drug: Dulaglutide ?INJECT 1.5 MG INTO THE SKIN ONCE A WEEK. ?  ? ?  ? ? ? ?OBJECTIVE:  ? ?Vital Signs: There were no vitals taken for this visit.  ?Wt Readings from Last 3 Encounters:  ?01/27/21 146 lb (66.2 kg)  ?09/20/20 145 lb (65.8 kg)  ?06/21/20 144 lb 8 oz (65.5 kg)  ? ? ? ?Exam: ?General: Pt appears well and is in NAD  ?Lungs: Clear with good BS bilat with no rales, rhonchi, or wheezes  ?Heart: RRR   ?Extremities: No pretibial edema.  ?Neuro: MS is good with appropriate affect, pt is alert and Ox3  ? ?DM Foot Exam 05/28/2021 ?The skin of the feet is intact without sores or ulcerations. ?The pedal pulses are 2+ on right and 2+ on left. ?The sensation is intact to a screening 5.07, 10 gram monofilament bilaterally ? ? ?DATA REVIEWED: ? Latest Reference Range & Units 01/27/21 10:40  ?Sodium 135 - 145 mEq/L 133 (L)  ?Potassium 3.5 - 5.1 mEq/L 4.0  ?Chloride 96 - 112 mEq/L 92 (L)  ?CO2 19 - 32 mEq/L 31  ?Glucose 70 - 99 mg/dL 998 (H)  ?BUN 6 - 23 mg/dL 17  ?Creatinine 0.40 - 1.20 mg/dL 3.38  ?Calcium 8.4 - 10.5 mg/dL 25.0  ?GFR >60.00 mL/min 61.39  ?MICROALB/CREAT RATIO 0.0 - 30.0 mg/g 0.9  ? ? Latest Reference Range & Units 01/27/21 10:40  ?Creatinine,U mg/dL 53.9  ?Microalb, Ur 0.0 - 1.9 mg/dL 0.8  ?MICROALB/CREAT RATIO 0.0 - 30.0 mg/g 0.9  ? ? ?ASSESSMENT / PLAN / RECOMMENDATIONS:  ? ?1) Type 2 Diabetes Mellitus, Poorly  controlled, Without complications - Most recent A1c of 8.7 %. Goal A1c < 7.5 %.   ? ?- A1c trending down  ?- Intolerant to  higher doses of Metformin  ?- Did not qualify for Trulicity for PA  ?- Will increase Trulicity as below  ? ?MEDICATIONS: ?- Continue  Glipizide 5 mg, 1 tablet BID ?- Continue Metformin 500 mg, 1 tablet  twice a day  ?- Increase  Trulicity 3 mg once weekly  ? ?EDUCATION /  INSTRUCTIONS: ?BG monitoring instructions: Patient is instructed to check her blood sugars 2 times a day, before breakfast and supper. ?Call [facility] if: BG persistently < 70  ?I reviewed the Rule of 15 for the treatment of hypoglycemia in detail with the patient. Literature supplied. ? ? ? ?2) Diabetic complications:  ?Eye: Does not have known diabetic retinopathy.  ?Neuro/ Feet: Does not have known diabetic peripheral neuropathy .  ?Renal: Patient does not have  known baseline CKD. She   is  on an ACEI/ARB at present.  ? ? ? ? ?F/U in 6 months ? ? ?Signed electronically by: ?Abby Raelyn Mora, MD ? ?[facility]  ?[facility] ?[address] ?[place], [place] [address] ?Phone: [phone] ?FAX: [phone] ? ? ?CC: ?Le, Thao P, DO ?[address] ?[place] [address] ?Phone: [phone]  ?Fax: [phone] ? ?Return to Endocrinology clinic as below: ?Future Appointments  ?Date Time Provider Department Center  ?05/28/2021 10:30 AM Jordynne Mccown, Konrad Dolores, MD [id number] None  ? ? ?  ? ? ?

## 2021-05-31 ENCOUNTER — Other Ambulatory Visit: Payer: Self-pay | Admitting: Internal Medicine

## 2021-06-03 ENCOUNTER — Other Ambulatory Visit: Payer: Self-pay

## 2021-06-03 MED ORDER — FREESTYLE LITE DEVI
0 refills | Status: AC
Start: 2021-06-03 — End: ?

## 2021-11-28 ENCOUNTER — Ambulatory Visit (INDEPENDENT_AMBULATORY_CARE_PROVIDER_SITE_OTHER): Payer: Medicare Other | Admitting: Internal Medicine

## 2021-11-28 ENCOUNTER — Encounter: Payer: Self-pay | Admitting: Internal Medicine

## 2021-11-28 VITALS — BP 110/72 | HR 91 | Ht 61.0 in | Wt 142.0 lb

## 2021-11-28 DIAGNOSIS — E1165 Type 2 diabetes mellitus with hyperglycemia: Secondary | ICD-10-CM | POA: Diagnosis not present

## 2021-11-28 LAB — POCT GLYCOSYLATED HEMOGLOBIN (HGB A1C): Hemoglobin A1C: 10.4 % — AB (ref 4.0–5.6)

## 2021-11-28 MED ORDER — GLIPIZIDE 10 MG PO TABS: 10.0000 mg | ORAL_TABLET | Freq: Two times a day (BID) | ORAL | 3 refills | Status: DC

## 2021-11-28 MED ORDER — METFORMIN HCL ER 500 MG PO TB24: 500.0000 mg | ORAL_TABLET | Freq: Two times a day (BID) | ORAL | 3 refills | Status: DC

## 2021-11-28 NOTE — Patient Instructions (Addendum)
-   Increase Glipizide 10 mg, 1 tablet before Breakfast and Supper ( On days of exercise take HALF a tablet of Glipizide before Breakfast)  - Continue Metformin 500 mg, 1 tablet  twice a day  - STOP Trulicity     HOW TO TREAT LOW BLOOD SUGARS (Blood sugar LESS THAN 70 MG/DL) Please follow the RULE OF 15 for the treatment of hypoglycemia treatment (when your (blood sugars are less than 70 mg/dL)   STEP 1: Take 15 grams of carbohydrates when your blood sugar is low, which includes:  3-4 GLUCOSE TABS  OR 3-4 OZ OF JUICE OR REGULAR SODA OR ONE TUBE OF GLUCOSE GEL    STEP 2: RECHECK blood sugar in 15 MINUTES STEP 3: If your blood sugar is still low at the 15 minute recheck --> then, go back to STEP 1 and treat AGAIN with another 15 grams of carbohydrates.

## 2021-11-28 NOTE — Progress Notes (Unsigned)
Name: Jillian Preston  Age/ Sex: 81 y.o., female   MRN/ DOB: [id number], 14-Jul-1940     PCP: Glenford Bayley, DO   Reason for Endocrinology Evaluation: Type 2 Diabetes Mellitus  Initial Endocrine Consultative Visit: 06/21/2020    PATIENT IDENTIFIER: Jillian Preston is a 81 y.o. female with a past medical history of T2DM and HTN. The patient has followed with Endocrinology clinic since 06/21/2020 for consultative assistance with management of her diabetes.  DIABETIC HISTORY:  Jillian Preston was diagnosed with DM yrs ago. Her hemoglobin A1c has ranged from 8.8% in 2021, peaking at 12.4% in 2022.  Trulicity started 04/7900  On her initial visit to our clinic , her  A1c 13.3%, we continued metformin, increased Trulicity and started Glipizide  SUBJECTIVE:   During the last visit (05/28/2021): A1c 8.7 %      Today (11/28/2021): Jillian Preston is here for a follow up in diabetes management.She is accompanied by her husband and daughter Stanton Kidney )  today .  She checks her blood sugars 1 -2times daily. The patient has  not had  hypoglycemic episodes   Eats 2.5 meals ( morning, noon and dinner)   Trulicity $409   Has occasional loose stools  Has GERD   HOME DIABETES REGIMEN:  Glipizide 5 mg, 1 tablet twice daily Metformin 500 mg, 1 tablet  twice a day Trulicity 3 mg once weekly      Statin: yes ACE-I/ARB: yes   METER DOWNLOAD SUMMARY: unable to download  Incorrect times and  dates 121- 325 mg/dL     DIABETIC COMPLICATIONS: Microvascular complications:  CKD Denies:  Last Eye Exam: Completed 03/2021  Macrovascular complications:   Denies: CAD, CVA, PVD   HISTORY:  Past Medical History:  Past Medical History:  Diagnosis Date   Closed right ankle fracture    Diabetes mellitus without complication ([facility])    GERD (gastroesophageal reflux disease)    Hyperlipidemia    Hypertension    Past Surgical History:  Past Surgical History:  Procedure Laterality Date    ABDOMINAL HYSTERECTOMY     BREAST SURGERY Left    CHOLECYSTECTOMY     COLONOSCOPY     ORIF ANKLE FRACTURE Right 03/10/2019   Procedure: OPEN REDUCTION INTERNAL FIXATION (ORIF) RIGHT ANKLE DISTAL FIBULAR LATERAL MALLEOLUS FRACTURE;  Surgeon: Hiram Gash, MD;  Location: [facility];  Service: Orthopedics;  Laterality: Right;   Social History:  reports that she has never smoked. She has never used smokeless tobacco. She reports that she does not currently use alcohol. She reports that she does not use drugs. Family History: No family history on file.   HOME MEDICATIONS: Allergies as of 11/28/2021       Reactions   Other    Other reaction(s): Unknown   Pollen Extract Other (See Comments)        Medication List        Accurate as of November 28, 2021 10:01 AM. If you have any questions, ask your nurse or doctor.          aspirin EC 81 MG tablet 1 tablet   atorvastatin 40 MG tablet Commonly known as: LIPITOR Take 40 mg by mouth daily.   Calcium 1000 + D 1000-20 MG-MCG Tabs Generic drug: Calcium Carb-Cholecalciferol   calcium carbonate 1250 (500 Ca) MG tablet Commonly known as: OS-CAL - dosed in mg of elemental calcium Take 1 tablet by mouth.   Centrum Adults Tabs Take by mouth.   cetirizine 10  MG chewable tablet Commonly known as: ZYRTEC Chew 10 mg by mouth daily.   Cranberry 450 MG Tabs 1 capsule   famotidine 20 MG tablet Commonly known as: PEPCID Take 20 mg by mouth 2 (two) times daily. Taking 20 tablet  in morning, 10 evening.   fenofibrate 160 MG tablet Take 160 mg by mouth daily.   FreeStyle Lite Devi Check blood sugar 2 times daily   FREESTYLE LITE test strip Generic drug: glucose blood USE AS INSTRUCTED TO CHECK BLOOD SUGAR 2 TIMES A DAY DX E11.65   glipiZIDE 5 MG tablet Commonly known as: GLUCOTROL Take 1 tablet (5 mg total) by mouth 2 (two) times daily before a meal.   lisinopril-hydrochlorothiazide 20-25 MG tablet Commonly  known as: ZESTORETIC Take 1 tablet by mouth daily.   loperamide 2 MG capsule Commonly known as: IMODIUM Take by mouth as needed for diarrhea or loose stools.   metFORMIN 500 MG 24 hr tablet Commonly known as: GLUCOPHAGE-XR Take 1 tablet (500 mg total) by mouth in the morning and at bedtime.   Trulicity 3 0000000 Sopn Generic drug: Dulaglutide Inject 3 mg as directed once a week.         OBJECTIVE:   Vital Signs: BP 110/72 (BP Location: Left Arm, Patient Position: Sitting, Cuff Size: Small)   Pulse 91   Ht 5\' 1"  (1.549 m)   Wt 142 lb (64.4 kg)   SpO2 95%   BMI 26.83 kg/m   Wt Readings from Last 3 Encounters:  11/28/21 142 lb (64.4 kg)  05/28/21 145 lb 3.2 oz (65.9 kg)  01/27/21 146 lb (66.2 kg)     Exam: General: Pt appears well and is in NAD  Lungs: Clear with good BS bilat with no rales, rhonchi, or wheezes  Heart: RRR   Extremities: No pretibial edema.  Neuro: MS is good with appropriate affect, pt is alert and Ox3   DM Foot Exam 05/28/2021 The skin of the feet is intact without sores or ulcerations. The pedal pulses are 2+ on right and 2+ on left. The sensation is intact to a screening 5.07, 10 gram monofilament bilaterally   DATA REVIEWED:  Latest Reference Range & Units 01/27/21 10:40  Sodium 135 - 145 mEq/L 133 (L)  Potassium 3.5 - 5.1 mEq/L 4.0  Chloride 96 - 112 mEq/L 92 (L)  CO2 19 - 32 mEq/L 31  Glucose 70 - 99 mg/dL 295 (H)  BUN 6 - 23 mg/dL 17  Creatinine 0.40 - 1.20 mg/dL 0.89  Calcium 8.4 - 10.5 mg/dL 10.2  GFR >60.00 mL/min 61.39  MICROALB/CREAT RATIO 0.0 - 30.0 mg/g 0.9    Latest Reference Range & Units 01/27/21 10:40  Creatinine,U mg/dL 84.5  Microalb, Ur 0.0 - 1.9 mg/dL 0.8  MICROALB/CREAT RATIO 0.0 - 30.0 mg/g 0.9    ASSESSMENT / PLAN / RECOMMENDATIONS:   1) Type 2 Diabetes Mellitus, Poorly  controlled, Without complications - Most recent A1c of 10.4 %. Goal A1c < 7.5 %.    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, not sure if the elevation is due to dietary indiscretion vs imperfect adherence - She will not be a candidate for SGLT-2 inhibitors due to cost issues as well -  Intolerant to  higher doses of Metformin  - Will increase Glipizide, discussed risk of hypoglycemia, we also discussed insulin as an option with an A1c >10.0%  but insulin will cause hardship with current circumstances  - Pt will also start water aerobics , she was advised to take ONLY half a tablet of Glipizide Before breakfast the day of exercise and to hold taking Glipizide if she is not going to eat breakfast   MEDICATIONS: - Increase   Glipizide 10 mg, 1 tablet BID - Continue Metformin 500 mg, 1 tablet  twice a day  - STOP Trulicity   EDUCATION / INSTRUCTIONS: BG monitoring instructions: Patient is instructed to check her blood sugars 2 times a day, before breakfast and supper. Call [facility] if: BG persistently < 70  I reviewed the Rule of 15 for the treatment of hypoglycemia in detail with the patient. Literature supplied.    2) Diabetic complications:  Eye: Does not have known diabetic retinopathy.  Neuro/ Feet: Does not have known diabetic peripheral neuropathy .  Renal: Patient does not have known baseline CKD. She   is  on an ACEI/ARB at present.      F/U in 4 months   Signed electronically by: Mack Guise, MD  [facility]  [facility] [address] [place], [place] [address] Phone: [phone] FAX: [phone]   CC: [address] [place] [address] Phone: [phone]  Fax: [phone]  Return to Endocrinology clinic as below: No future appointments.

## 2022-01-21 ENCOUNTER — Telehealth: Payer: Self-pay

## 2022-01-21 DIAGNOSIS — E1165 Type 2 diabetes mellitus with hyperglycemia: Secondary | ICD-10-CM

## 2022-01-21 MED ORDER — GLIPIZIDE 10 MG PO TABS: 20.0000 mg | ORAL_TABLET | Freq: Two times a day (BID) | ORAL | 3 refills | Status: DC

## 2022-01-21 NOTE — Telephone Encounter (Signed)
Patient daughter advised and verbalized understanding

## 2022-01-21 NOTE — Telephone Encounter (Signed)
Patient daughter Steward Drone states patient blood sugar was 485 yesterday on lab work  01/21/22  325 9AM  01/21/22 433 10:15am   Patient has been taking the Metformin and the Glipizide as prescribed.   Patient doesn't have 2 Trulicity injection that have not been used.

## 2022-03-05 ENCOUNTER — Other Ambulatory Visit: Payer: Self-pay

## 2022-03-05 MED ORDER — GLIPIZIDE 10 MG PO TABS: 20.0000 mg | ORAL_TABLET | Freq: Two times a day (BID) | ORAL | 3 refills | Status: DC

## 2022-03-05 NOTE — Telephone Encounter (Signed)
Are you taking any type of steroids? No  Are you sick or becoming sick? Yes Has a cold, but not on any meds for cold Is this the first elevated blood sugar reading? No  Have you tried to correct it with insulin? Yes  Have you been taking/taken today all of the prescribed medications? Yes  What is your current diabetic insulin or oral medication dosage? glipiZIDE and metFORMIN    Date Time Reading Notes       02/01/22 8:42 AM 233   02/01/22 4:34 AM 344   02/15/22 8:22 AM 298   02/23/22 9:15 AM 322   02/23/22 5:40 PM 390   02/25/22 9:30 AM 420   03/02/2022 8:00 AM  254   03/03/2022 8:28 AM 455   03/04/22 9:39 AM 322   03/05/22 Nothing recorded           Please advise Patient's daughter is the one that called, Lysle Rubens [phone].  Patient's spouse has been sick lately and patient has been stressed concerning his health.  Per Hassan Rowan patient has been taking her Metformin and Glipizide as directed.

## 2022-03-06 MED ORDER — PIOGLITAZONE HCL 15 MG PO TABS: 15.0000 mg | ORAL_TABLET | Freq: Every day | ORAL | 1 refills | Status: DC

## 2022-03-06 NOTE — Addendum Note (Signed)
Addended by: Jefferson Fuel on: 03/06/2022 09:51 AM   Modules accepted: Orders

## 2022-03-06 NOTE — Telephone Encounter (Signed)
Patient daughter Hassan Rowan advised and verbalized understanding.

## 2022-03-06 NOTE — Addendum Note (Signed)
Addended by: Dorita Sciara on: 03/06/2022 09:46 AM   Modules accepted: Orders

## 2022-03-13 ENCOUNTER — Other Ambulatory Visit: Payer: Self-pay

## 2022-03-13 MED ORDER — FREESTYLE LITE TEST VI STRP: ORAL_STRIP | 5 refills | Status: DC

## 2022-03-18 ENCOUNTER — Telehealth: Payer: Self-pay

## 2022-03-18 ENCOUNTER — Other Ambulatory Visit: Payer: Self-pay

## 2022-03-18 MED ORDER — FREESTYLE LITE TEST VI STRP: ORAL_STRIP | 5 refills | Status: DC

## 2022-03-18 NOTE — Telephone Encounter (Signed)
Per [facility] patient insurance will only allow her to test BS once a day because she is not on insulin. Would you like to do a PA for patient to test 2 times daily of just change script to test once a day.

## 2022-03-19 ENCOUNTER — Other Ambulatory Visit: Payer: Self-pay | Admitting: Internal Medicine

## 2022-04-01 ENCOUNTER — Other Ambulatory Visit: Payer: Self-pay

## 2022-04-01 MED ORDER — FREESTYLE LITE TEST VI STRP: ORAL_STRIP | 5 refills | Status: DC

## 2022-04-02 ENCOUNTER — Other Ambulatory Visit (HOSPITAL_BASED_OUTPATIENT_CLINIC_OR_DEPARTMENT_OTHER): Payer: Self-pay

## 2022-04-03 ENCOUNTER — Other Ambulatory Visit: Payer: Self-pay

## 2022-04-03 MED ORDER — FREESTYLE LITE TEST VI STRP: ORAL_STRIP | 5 refills | Status: DC

## 2022-04-13 ENCOUNTER — Ambulatory Visit (INDEPENDENT_AMBULATORY_CARE_PROVIDER_SITE_OTHER): Payer: Medicare Other | Admitting: Internal Medicine

## 2022-04-13 ENCOUNTER — Encounter: Payer: Self-pay | Admitting: Internal Medicine

## 2022-04-13 VITALS — BP 122/80 | HR 81 | Ht 61.0 in | Wt 142.8 lb

## 2022-04-13 DIAGNOSIS — E1165 Type 2 diabetes mellitus with hyperglycemia: Secondary | ICD-10-CM | POA: Diagnosis not present

## 2022-04-13 LAB — POCT GLYCOSYLATED HEMOGLOBIN (HGB A1C): Hemoglobin A1C: 12.1 % — AB (ref 4.0–5.6)

## 2022-04-13 MED ORDER — PIOGLITAZONE HCL 30 MG PO TABS: 30.0000 mg | ORAL_TABLET | Freq: Every day | ORAL | 3 refills | Status: DC

## 2022-04-13 MED ORDER — METFORMIN HCL ER 500 MG PO TB24: 500.0000 mg | ORAL_TABLET | Freq: Two times a day (BID) | ORAL | 3 refills | Status: DC

## 2022-04-13 MED ORDER — GLIPIZIDE 10 MG PO TABS: 20.0000 mg | ORAL_TABLET | Freq: Two times a day (BID) | ORAL | 3 refills | Status: DC

## 2022-04-13 NOTE — Patient Instructions (Addendum)
-   Continue Glipizide 10 mg, 2 tablets before Breakfast and 2 tablet before Supper - Continue Metformin 500 mg, 1 tablet  twice a day  - Increase Actos (Pioglitazone ) 30 mg, 1 tablet every morning     HOW TO TREAT LOW BLOOD SUGARS (Blood sugar LESS THAN 70 MG/DL) Please follow the RULE OF 15 for the treatment of hypoglycemia treatment (when your (blood sugars are less than 70 mg/dL)   STEP 1: Take 15 grams of carbohydrates when your blood sugar is low, which includes:  3-4 GLUCOSE TABS  OR 3-4 OZ OF JUICE OR REGULAR SODA OR ONE TUBE OF GLUCOSE GEL    STEP 2: RECHECK blood sugar in 15 MINUTES STEP 3: If your blood sugar is still low at the 15 minute recheck --> then, go back to STEP 1 and treat AGAIN with another 15 grams of carbohydrates.

## 2022-04-13 NOTE — Progress Notes (Signed)
Name: Jillian Preston  Age/ Sex: 82 y.o., female   MRN/ DOB: [id number], 1940/07/30     PCP: Glenford Bayley, DO   Reason for Endocrinology Evaluation: Type 2 Diabetes Mellitus  Initial Endocrine Consultative Visit: 06/21/2020    PATIENT IDENTIFIER: Ms. Jillian Preston is a 82 y.o. female with a past medical history of T2DM and HTN. The patient has followed with Endocrinology clinic since 06/21/2020 for consultative assistance with management of her diabetes.  DIABETIC HISTORY:  Ms. Jillian Preston was diagnosed with DM yrs ago. Her hemoglobin A1c has ranged from 8.8% in 2021, peaking at 12.4% in 2022.  Trulicity started [id number]  On her initial visit to our clinic , her  A1c 13.3%, we continued metformin, increased Trulicity and started Glipizide  SUBJECTIVE:   During the last visit (11/28/2021): A1c 10.4%    Today (04/13/2022): Ms. Jillian Preston is here for a follow up in diabetes management.She is accompanied by her husband and daughter Jillian Preston )  today .  She checks her blood sugars 1 time daily. The patient has  not had  hypoglycemic episodes   Eats 2.5 meals ( morning, noon and dinner)   Loose stools have been improving  She is on Famotidine for GERD   HOME DIABETES REGIMEN:  Glipizide 10 mg, 2 tablet twice daily Metformin 500 mg, 1 tablet  twice a day Pioglitazone 15 mg daily    Statin: yes ACE-I/ARB: yes   METER DOWNLOAD SUMMARY: unable to download  Incorrect times and  dates 152- 352 mg/dL     DIABETIC COMPLICATIONS: Microvascular complications:  CKD Denies:  Last Eye Exam: Completed 03/2021  Macrovascular complications:   Denies: CAD, CVA, PVD   HISTORY:  Past Medical History:  Past Medical History:  Diagnosis Date   Closed right ankle fracture    Diabetes mellitus without complication ([place])    GERD (gastroesophageal reflux disease)    Hyperlipidemia    Hypertension    Past Surgical History:  Past Surgical History:  Procedure Laterality Date   ABDOMINAL  HYSTERECTOMY     BREAST SURGERY Left    CHOLECYSTECTOMY     COLONOSCOPY     ORIF ANKLE FRACTURE Right 03/10/2019   Procedure: OPEN REDUCTION INTERNAL FIXATION (ORIF) RIGHT ANKLE DISTAL FIBULAR LATERAL MALLEOLUS FRACTURE;  Surgeon: Hiram Gash, MD;  Location: [facility];  Service: Orthopedics;  Laterality: Right;   Social History:  reports that she has never smoked. She has never used smokeless tobacco. She reports that she does not currently use alcohol. She reports that she does not use drugs. Family History: No family history on file.   HOME MEDICATIONS: Allergies as of 04/13/2022       Reactions   Other    Other reaction(s): Unknown   Pollen Extract Other (See Comments)        Medication List        Accurate as of April 13, 2022 10:15 AM. If you have any questions, ask your nurse or doctor.          aspirin EC 81 MG tablet 1 tablet   atorvastatin 40 MG tablet Commonly known as: LIPITOR Take 40 mg by mouth daily.   Calcium 1000 + D 1000-20 MG-MCG Tabs Generic drug: Calcium Carb-Cholecalciferol   calcium carbonate 1250 (500 Ca) MG tablet Commonly known as: OS-CAL - dosed in mg of elemental calcium Take 1 tablet by mouth.   Centrum Adults Tabs Take by mouth.   cetirizine 10 MG chewable tablet Commonly known  as: ZYRTEC Chew 10 mg by mouth daily.   Cranberry 450 MG Tabs 1 capsule   famotidine 20 MG tablet Commonly known as: PEPCID Take 20 mg by mouth 2 (two) times daily. Taking 20 tablet  in morning, 10 evening.   fenofibrate 160 MG tablet Take 160 mg by mouth daily.   FreeStyle Lite Devi Check blood sugar 2 times daily   FREESTYLE LITE test strip Generic drug: glucose blood Check blood sugar 1x times daily   DX E11.65   glipiZIDE 10 MG tablet Commonly known as: GLUCOTROL Take 2 tablets (20 mg total) by mouth 2 (two) times daily before a meal.   lisinopril-hydrochlorothiazide 20-25 MG tablet Commonly known as: ZESTORETIC Take  1 tablet by mouth daily.   loperamide 2 MG capsule Commonly known as: IMODIUM Take by mouth as needed for diarrhea or loose stools.   metFORMIN 500 MG 24 hr tablet Commonly known as: GLUCOPHAGE-XR Take 1 tablet (500 mg total) by mouth in the morning and at bedtime.   pioglitazone 15 MG tablet Commonly known as: Actos Take 1 tablet (15 mg total) by mouth daily.         OBJECTIVE:   Vital Signs: BP 122/80 (BP Location: Left Arm, Patient Position: Sitting, Cuff Size: Large)   Pulse 81   Ht 5' 1"$  (1.549 m)   Wt 142 lb 12.8 oz (64.8 kg)   SpO2 93%   BMI 26.98 kg/m   Wt Readings from Last 3 Encounters:  04/13/22 142 lb 12.8 oz (64.8 kg)  11/28/21 142 lb (64.4 kg)  05/28/21 145 lb 3.2 oz (65.9 kg)     Exam: General: Pt appears well and is in NAD  Lungs: Clear with good BS bilat  Heart: RRR   Extremities: No pretibial edema.  Neuro: MS is good with appropriate affect, pt is alert and Ox3   DM Foot Exam 05/28/2021 The skin of the feet is intact without sores or ulcerations. The pedal pulses are 2+ on right and 2+ on left. The sensation is intact to a screening 5.07, 10 gram monofilament bilaterally   DATA REVIEWED:   ASSESSMENT / PLAN / RECOMMENDATIONS:   1) Type 2 Diabetes Mellitus, Poorly  controlled, Without complications - Most recent A1c of 12.1 %. Goal A1c < 7.5 %.     - Pt with worsening glycemic control  - GLP-1 RA and SGLT-2i, are cost prohibitive  - Intolerant to  higher doses of Metformin  - In reviewing glucose meter, her BG's are trending down gradually - She has been taking Glipizide in the morning and afternoon rather than supper time , discussed importance of taking it before 1st and last meal of the day  - Will increase pioglitazone  - I again discussed insulin as an alternative with persistent hyperglycemia, but will cause hardship on the pt and family    MEDICATIONS: Continue glipizide 10 mg, 2 tablet BID Continue Metformin 500 mg, 1 tablet   twice a day  Increase pioglitazone 30 mg daily  EDUCATION / INSTRUCTIONS: BG monitoring instructions: Patient is instructed to check her blood sugars 2 times a day, before breakfast and supper. Call [facility] if: BG persistently < 70  I reviewed the Rule of 15 for the treatment of hypoglycemia in detail with the patient. Literature supplied.    2) Diabetic complications:  Eye: Does not have known diabetic retinopathy.  Neuro/ Feet: Does not have known diabetic peripheral neuropathy .  Renal: Patient does not have known  baseline CKD. She   is  on an ACEI/ARB at present.      F/U in 5 months   Signed electronically by: Mack Guise, MD  [facility]  [facility] [address] [place], [place] [address] Phone: [phone] FAX: [phone]   CC: [address] [place] [address] Phone: [phone]  Fax: [phone]  Return to Endocrinology clinic as below: No future appointments.

## 2022-10-13 ENCOUNTER — Ambulatory Visit: Payer: Medicare Other | Admitting: Internal Medicine

## 2022-11-20 ENCOUNTER — Other Ambulatory Visit: Payer: Self-pay

## 2022-11-20 MED ORDER — FREESTYLE LITE TEST VI STRP: ORAL_STRIP | 5 refills | Status: DC

## 2022-11-23 ENCOUNTER — Ambulatory Visit (INDEPENDENT_AMBULATORY_CARE_PROVIDER_SITE_OTHER): Payer: Medicare Other | Admitting: Internal Medicine

## 2022-11-23 ENCOUNTER — Other Ambulatory Visit: Payer: Self-pay

## 2022-11-23 ENCOUNTER — Encounter: Payer: Self-pay | Admitting: Internal Medicine

## 2022-11-23 VITALS — BP 112/70 | HR 94 | Ht 61.0 in | Wt 156.0 lb

## 2022-11-23 DIAGNOSIS — Z7984 Long term (current) use of oral hypoglycemic drugs: Secondary | ICD-10-CM

## 2022-11-23 DIAGNOSIS — Z794 Long term (current) use of insulin: Secondary | ICD-10-CM

## 2022-11-23 DIAGNOSIS — E1165 Type 2 diabetes mellitus with hyperglycemia: Secondary | ICD-10-CM

## 2022-11-23 LAB — POCT GLYCOSYLATED HEMOGLOBIN (HGB A1C): Hemoglobin A1C: 12.1 % — AB (ref 4.0–5.6)

## 2022-11-23 MED ORDER — LANTUS SOLOSTAR 100 UNIT/ML ~~LOC~~ SOPN: 12.0000 [IU] | PEN_INJECTOR | Freq: Every day | SUBCUTANEOUS | 11 refills | Status: AC

## 2022-11-23 MED ORDER — FREESTYLE LITE TEST VI STRP: 1.0000 | ORAL_STRIP | Freq: Two times a day (BID) | 3 refills | Status: DC

## 2022-11-23 MED ORDER — FREESTYLE LITE TEST VI STRP: ORAL_STRIP | 3 refills | Status: AC

## 2022-11-23 MED ORDER — INSULIN PEN NEEDLE 32G X 4 MM MISC: 1.0000 | Freq: Every day | 3 refills | Status: DC

## 2022-11-23 MED ORDER — GLIPIZIDE 10 MG PO TABS: 20.0000 mg | ORAL_TABLET | Freq: Two times a day (BID) | ORAL | 3 refills | Status: AC

## 2022-11-23 MED ORDER — METFORMIN HCL ER 500 MG PO TB24: 500.0000 mg | ORAL_TABLET | Freq: Two times a day (BID) | ORAL | 3 refills | Status: DC

## 2022-11-23 NOTE — Progress Notes (Signed)
Name: Jillian Preston  Age/ Sex: 82 y.o., female   MRN/ DOB: [id number], 1940/05/20     PCP: Lenell Antu, DO   Reason for Endocrinology Evaluation: Type 2 Diabetes Mellitus  Initial Endocrine Consultative Visit: 06/21/2020    PATIENT IDENTIFIER: Jillian Preston is a 82 y.o. female with a past medical history of T2DM and HTN. The patient has followed with Endocrinology clinic since 06/21/2020 for consultative assistance with management of her diabetes.  DIABETIC HISTORY:  Jillian Preston was diagnosed with DM yrs ago. Her hemoglobin A1c has ranged from 8.8% in 2021, peaking at 12.4% in 2022.  Trulicity started 04/2020  On her initial visit to our clinic , her  A1c 13.3%, we continued metformin, increased Trulicity and started Glipizide    Trulicity was discontinued 2023 as it was close prohibitive and they did not qualify for patient assistance  Pioglitazone was discontinued 11/2022 due to lower extremity edema and persistent hyperglycemia  Basal insulin started 11/2022 with an A1c of 12.1%   SUBJECTIVE:   During the last visit (04/13/2022): A1c 12.1%    Today (11/23/2022): Jillian Preston is here for a follow up in diabetes management.She is accompanied by her husband and daughter Jillian Dandy ) today .  She checks her blood sugars 1 time daily. The patient has  not had  hypoglycemic episodes   Eats 2.5 meals ( morning, noon and dinner)   Denies polydipsia Has nocturia x2 Denies constipation or diarrhea  Denies sugar-sweetened beverages  She lives in assisted living     HOME DIABETES REGIMEN:  Glipizide 10 mg, 2 tablet twice daily Metformin 500 mg, 1 tablet  twice a day Pioglitazone 30 mg daily      Statin: yes ACE-I/ARB: yes   METER DOWNLOAD SUMMARY: 7/23-9/20/2024  Average Number Tests/Day = 0.9 Overall Mean FS Glucose = 178 Standard Deviation = 54.3  BG Ranges: Low = 151 High = 433  BG Target % Results: % In target = 2 % Over target = 98 % Under target =  0  Hypoglycemic Events/30 Days: BG < 50 = 0 Episodes of symptomatic severe hypoglycemia = 0     DIABETIC COMPLICATIONS: Microvascular complications:  CKD Denies:  Last Eye Exam: Completed 03/2021  Macrovascular complications:   Denies: CAD, CVA, PVD   HISTORY:  Past Medical History:  Past Medical History:  Diagnosis Date   Closed right ankle fracture    Diabetes mellitus without complication (H[facility])    GERD (gastroesophageal reflux disease)    Hyperlipidemia    Hypertension    Past Surgical History:  Past Surgical History:  Procedure Laterality Date   ABDOMINAL HYSTERECTOMY     BREAST SURGERY Left    CHOLECYSTECTOMY     COLONOSCOPY     ORIF ANKLE FRACTURE Right 03/10/2019   Procedure: OPEN REDUCTION INTERNAL FIXATION (ORIF) RIGHT ANKLE DISTAL FIBULAR LATERAL MALLEOLUS FRACTURE;  Surgeon: Bjorn Pippin, MD;  Location: [facility];  Service: Orthopedics;  Laterality: Right;   Social History:  reports that she has never smoked. She has never used smokeless tobacco. She reports that she does not currently use alcohol. She reports that she does not use drugs. Family History: No family history on file.   HOME MEDICATIONS: Allergies as of 11/23/2022       Reactions   Other    Other reaction(s): Unknown   Pollen Extract Other (See Comments)        Medication List        Accurate  as of November 23, 2022  9:39 AM. If you have any questions, ask your nurse or doctor.          STOP taking these medications    pioglitazone 30 MG tablet Commonly known as: Actos Stopped by: Johnney Ou Macguire Holsinger       TAKE these medications    aspirin EC 81 MG tablet 1 tablet   atorvastatin 40 MG tablet Commonly known as: LIPITOR Take 40 mg by mouth daily.   Calcium 1000 + D 1000-20 MG-MCG Tabs Generic drug: Calcium Carb-Cholecalciferol   calcium carbonate 1250 (500 Ca) MG tablet Commonly known as: OS-CAL - dosed in mg of elemental calcium Take 1 tablet  by mouth.   Centrum Adults Tabs Take by mouth.   cetirizine 10 MG chewable tablet Commonly known as: ZYRTEC Chew 10 mg by mouth daily.   Cranberry 450 MG Tabs 1 capsule   famotidine 20 MG tablet Commonly known as: PEPCID Take 20 mg by mouth 2 (two) times daily. Taking 20 tablet  in morning, 10 evening.   fenofibrate 160 MG tablet Take 160 mg by mouth daily.   FreeStyle Lite Devi Check blood sugar 2 times daily   FREESTYLE LITE test strip Generic drug: glucose blood 1 each by Other route 2 (two) times daily. Check blood sugar 1x times daily   DX E11.65 What changed:  how much to take how to take this when to take this Changed by: Johnney Ou Robbie Nangle   glipiZIDE 10 MG tablet Commonly known as: GLUCOTROL Take 2 tablets (20 mg total) by mouth 2 (two) times daily before a meal.   Insulin Pen Needle 32G X 4 MM Misc 1 Device by Does not apply route daily in the afternoon. Started by: Johnney Ou Dmitriy Gair   Lantus SoloStar 100 UNIT/ML Solostar Pen Generic drug: insulin glargine Inject 12 Units into the skin daily. Started by: Johnney Ou Christin Moline   lisinopril-hydrochlorothiazide 20-25 MG tablet Commonly known as: ZESTORETIC Take 1 tablet by mouth daily.   loperamide 2 MG capsule Commonly known as: IMODIUM Take by mouth as needed for diarrhea or loose stools.   metFORMIN 500 MG 24 hr tablet Commonly known as: GLUCOPHAGE-XR Take 1 tablet (500 mg total) by mouth in the morning and at bedtime.         OBJECTIVE:   Vital Signs: BP 112/70 (BP Location: Left Arm, Patient Position: Sitting, Cuff Size: Large)   Pulse 94   Ht 5\' 1"  (1.549 m)   Wt 156 lb (70.8 kg)   SpO2 96%   BMI 29.48 kg/m   Wt Readings from Last 3 Encounters:  11/23/22 156 lb (70.8 kg)  04/13/22 142 lb 12.8 oz (64.8 kg)  11/28/21 142 lb (64.4 kg)     Exam: General: Pt appears well and is in NAD  Lungs: Clear with good BS bilat  Heart: RRR   Extremities: Trace pretibial edema.  Neuro:  MS is good with appropriate affect, pt is alert and Ox3   DM Foot Exam 05/28/2021 The skin of the feet is intact without sores or ulcerations. The pedal pulses are 2+ on right and 2+ on left. The sensation is intact to a screening 5.07, 10 gram monofilament bilaterally   DATA REVIEWED:   ASSESSMENT / PLAN / RECOMMENDATIONS:   1) Type 2 Diabetes Mellitus, Poorly  controlled, Without complications - Most recent A1c of 12.1 %. Goal A1c < 7.5 %.    -Patient continues with hyperglycemia -She has developed lower extremity  edema, will discontinue pioglitazone - GLP-1 RA and SGLT-2i, are cost prohibitive , and she does not qualify for patient assistance - Intolerant to  higher doses of Metformin  -I have recommended insulin therapy, of note, the patient used to be on basal insulin in the past -She was trained by my CMA on insulin pen use -They were encouraged to contact us with hypoglycemia -Labs are done through PCPs office, these are not available at this time    MEDICATIONS: Continue glipizide 10 mg, 2 tablet BID Continue Metformin 500 mg, 1 tablet  twice a day  Stop pioglitazone 30 mg daily Start Lantus 12 units once daily  EDUCATION / INSTRUCTIONS: BG monitoring instructions: Patient is instructed to check her blood sugars 2 times a day, before breakfast and supper. Call [facility] if: BG persistently < 70  I reviewed the Rule of 15 for the treatment of hypoglycemia in detail with the patient. Literature supplied.    2) Diabetic complications:  Eye: Does not have known diabetic retinopathy.  Neuro/ Feet: Does not have known diabetic peripheral neuropathy .  Renal: Patient does not have known baseline CKD. She   is  on an ACEI/ARB at present.      F/U in 3 months   Signed electronically by: Lyndle Herrlich, MD  [facility]  [facility] [address] [place], [place] [address] Phone: [phone] FAX:  [phone]   CC: [place] Flaming [address] [place] [address] Phone: [phone]  Fax: [phone]  Return to Endocrinology clinic as below: No future appointments.

## 2022-11-23 NOTE — Patient Instructions (Addendum)
-   Continue Glipizide 10 mg, 2 tablets before Breakfast and 2 tablet before Supper - Continue Metformin 500 mg, 1 tablet  twice a day  - STOP Actos (Pioglitazone ) 30 mg, 1 tablet every morning  - Start Lantus 12 units ONCE a day     HOW TO TREAT LOW BLOOD SUGARS (Blood sugar LESS THAN 70 MG/DL) Please follow the RULE OF 15 for the treatment of hypoglycemia treatment (when your (blood sugars are less than 70 mg/dL)   STEP 1: Take 15 grams of carbohydrates when your blood sugar is low, which includes:  3-4 GLUCOSE TABS  OR 3-4 OZ OF JUICE OR REGULAR SODA OR ONE TUBE OF GLUCOSE GEL    STEP 2: RECHECK blood sugar in 15 MINUTES STEP 3: If your blood sugar is still low at the 15 minute recheck --> then, go back to STEP 1 and treat AGAIN with another 15 grams of carbohydrates.

## 2023-03-30 ENCOUNTER — Ambulatory Visit: Payer: Medicare Other | Admitting: Internal Medicine

## 2023-11-17 ENCOUNTER — Other Ambulatory Visit: Payer: Self-pay | Admitting: Internal Medicine

## 2024-02-16 ENCOUNTER — Other Ambulatory Visit: Payer: Self-pay | Admitting: Internal Medicine

## 2024-02-19 ENCOUNTER — Other Ambulatory Visit: Payer: Self-pay | Admitting: Internal Medicine

## 2024-03-30 NOTE — Progress Notes (Signed)
 [facility] CONSULT NOTE  Patient Care Team: Ladora Jaynie SQUIBB, DO as PCP - General (Family Medicine) Toribio Arabia, NP as Nurse Practitioner (Nurse Practitioner)   ASSESSMENT & PLAN 84 y.o.female with history of hyperlipidemia, hypertension, vitamin D deficiency, CVA, DM2, osteoarthritis, CKD 3A referred for decreased white blood cell counts.  Records showed patient has leukocytosis.  Clinically, she has no concerning symptoms.  Reported chronic sinus symptoms with severe allergies after moving to [place] .  No recent infection.  We discussed obtaining testing today and see if any concerning findings.  If persistent borderline neutrophilia, may continue long-term follow-up and surveillance.  Reports mammogram is not up-to-date.  She will follow-up with primary care provider.  Assessment & Plan Neutrophilia Lab today. Based on results, further testing may be needed. If borderline persistent neutrophilia, will follow in 3 months and repeat testing again.  Orders Placed This Encounter  Procedures   CBC with Differential (Cancer Center Only)    Standing Status:   Future    Number of Occurrences:   1    Expiration Date:   03/31/2025   CMP (Cancer Center only)    Standing Status:   Future    Number of Occurrences:   1    Expiration Date:   03/31/2025   Lactate dehydrogenase    Standing Status:   Future    Number of Occurrences:   1    Expiration Date:   03/31/2025   Sedimentation rate    Standing Status:   Future    Number of Occurrences:   1    Expiration Date:   03/31/2025   BCR-ABL1, CML/ALL, PCR, QUANT    Standing Status:   Future    Number of Occurrences:   1    Expiration Date:   03/31/2025   All questions were answered. The patient knows to call the clinic with any problems, questions or concerns.   Jillian JAYSON Chihuahua, MD 03/31/2024 3:34 PM   CHIEF COMPLAINTS/PURPOSE OF CONSULTATION:  Leukopenia   HISTORY OF PRESENTING ILLNESS:  Jillian Preston 84 y.o.  female is here because of decreased WBC. She is with Jillian Preston daughter.   Patient has history of hyperlipidemia, hypertension, vitamin D deficiency, CVA, DM2, osteoarthritis, CKD 3A.  Available outside records reviewed. Records dated  07/27/2023 WBC 14 hemoglobin 14 MCV 94 platelet 222.  ANC 8.7 ALC 3.6 absolute monocyte 0.8 02/22/2024 show WBC of 17.1, hemoglobin 14 MCV 98 platelet 211.  ANC 10.9 ALC 4.1.  Absolute monocyte 1.4  Records also show elevated hemoglobin A1c with uncontrolled diabetes from 2024-2025  01/20/2024 creatinine 1.22 glucose 275 BUN 29 calcium 9.7 total protein 6.7 albumin 4.1.  Normal LFT and alkaline phosphatase and total bilirubin.  She report of chronic sinus problem, allergies to all the pollen. She is from [facility].  No infection. No pneumonia, or coughing, fever, chills.  Cigarette smoking No No chronic joint swelling. Right knee joint pain sometimes but not daily. No redness.   Loose stool if having fatty food from retirement living.   No new medication. She has been taking insulin  and DM for the past year.  No Recent surgery or trauma.  Prior splenectomy or asplenia No  Glucocorticoid No  No night sweats, weight loss, loss of appetite, mass or lump.  No bleeding, bloody stool or dark stool, bloody urine, vaginal bleeding.  MEDICAL HISTORY:  Past Medical History:  Diagnosis Date   Closed right ankle fracture    Diabetes mellitus without complication (HCC)  GERD (gastroesophageal reflux disease)    Hyperlipidemia    Hypertension     SURGICAL HISTORY: Past Surgical History:  Procedure Laterality Date   ABDOMINAL HYSTERECTOMY     BREAST SURGERY Left    CHOLECYSTECTOMY     COLONOSCOPY     ORIF ANKLE FRACTURE Right 03/10/2019   Procedure: OPEN REDUCTION INTERNAL FIXATION (ORIF) RIGHT ANKLE DISTAL FIBULAR LATERAL MALLEOLUS FRACTURE;  Surgeon: Cristy Bonner DASEN, MD;  Location: [facility];  Service: Orthopedics;  Laterality: Right;     SOCIAL HISTORY: Social History   Socioeconomic History   Marital status: Unknown    Spouse name: Not on file   Number of children: Not on file   Years of education: Not on file   Highest education level: Not on file  Occupational History   Not on file  Tobacco Use   Smoking status: Never   Smokeless tobacco: Never  Substance and Sexual Activity   Alcohol use: Not Currently   Drug use: Never   Sexual activity: Not on file  Other Topics Concern   Not on file  Social History Narrative   Not on file   Social Drivers of Health   Tobacco Use: Low Risk (11/23/2022)   Patient History    Smoking Tobacco Use: Never    Smokeless Tobacco Use: Never    Passive Exposure: Not on file  Financial Resource Strain: Not on file  Food Insecurity: No Food Insecurity (03/31/2024)   ACO [place]ach    Worried About Running Out of Food in the Last Year: No    Ran Out of Food in the Last Year: No  Transportation Needs: No Transportation Needs (03/31/2024)   ACO Re[place]    Lack of Transportation: No  Physical Activity: Not on file  Stress: Not on file  Social Connections: Unknown (07/15/2021)   Received from [facility]   Social Network    Social Network: Not on file  Intimate Partner Violence: At Risk (03/31/2024)   ACO Reach    Feels Physically and Emotionally Safe: No    Physically Hurt by Someone: No    Humiliated or Emotionally Abused by Someone: No  Depression (PHQ2-9): Not on file  Alcohol Screen: Not on file  Housing: At Risk (03/31/2024)   ACO Reach    Has Housing: No    Worried About Losing Housing: No    Unable to Get Utilities: No  Utilities: At Risk (03/31/2024)   ACO Reach    Has Housing: No    Worried About Losing Housing: No    Unable to Get Utilities: No  Health Literacy: Not on file    FAMILY HISTORY: No family history on file.  ALLERGIES:  is allergic to other and pollen extract.  MEDICATIONS:  Current Outpatient Medications  Medication Sig Dispense Refill    aspirin  81 MG EC tablet 1 tablet     atorvastatin (LIPITOR) 40 MG tablet Take 40 mg by mouth daily.     Blood Glucose Monitoring Suppl (FREESTYLE LITE) DEVI Check blood sugar 2 times daily 1 each 0   Calcium Carb-Cholecalciferol (CALCIUM 1000 + D) 1000-20 MG-MCG TABS      cetirizine (ZYRTEC) 10 MG chewable tablet Chew 10 mg by mouth daily.     Cranberry 450 MG TABS 1 capsule     famotidine (PEPCID) 20 MG tablet Take 20 mg by mouth 2 (two) times daily. Taking 20 tablet  in morning, 10 evening.     fenofibrate 160 MG tablet Take  160 mg by mouth daily.     glipiZIDE  (GLUCOTROL ) 10 MG tablet Take 2 tablets (20 mg total) by mouth 2 (two) times daily before a meal. (Patient taking differently: Take 10 mg by mouth daily before breakfast.) 360 tablet 3   glucose blood (FREESTYLE LITE) test strip Check blood sugar 2x times daily   DX E11.65 200 strip 3   insulin  glargine (LANTUS  SOLOSTAR) 100 UNIT/ML Solostar Pen Inject 12 Units into the skin daily. 15 mL 11   JARDIANCE 10 MG TABS tablet Take 10 mg by mouth daily.     lisinopril-hydrochlorothiazide (ZESTORETIC) 20-25 MG tablet Take 1 tablet by mouth daily.     loperamide (IMODIUM) 2 MG capsule Take by mouth as needed for diarrhea or loose stools.     Multiple Vitamins-Minerals (CENTRUM ADULTS) TABS Take by mouth.     RELION PEN NEEDLES 32G X 4 MM MISC USE 1 PEN NEEDLE ONCE DAILY IN  THE  AFTERNOON 100 each 0   No current facility-administered medications for this visit.    REVIEW OF SYSTEMS:   All relevant systems were reviewed with the patient and are negative.  PHYSICAL EXAMINATION: ECOG PERFORMANCE STATUS: 1  Vitals:   03/31/24 1502  BP: (!) 124/59  Pulse: 91  Resp: 17  Temp: (!) 97.2 F (36.2 C)  SpO2: 95%   Filed Weights   03/31/24 1502  Weight: 153 lb 3.2 oz (69.5 kg)    GENERAL: alert, no distress and comfortable SKIN: skin color normal and no petechiae on exposed skin EYES:  sclera clear NECK: No palpable mass LYMPH:  no  palpable cervical, axillary lymphadenopathy  LUNGS: clear to auscultation and percussion with normal breathing effort HEART: regular rate & rhythm and no murmurs  ABDOMEN: abdomen soft, non-tender and nondistended.   LABORATORY DATA:  I have reviewed the data relevant to the visit.  New labs ordered.

## 2024-03-31 ENCOUNTER — Inpatient Hospital Stay

## 2024-03-31 VITALS — BP 124/59 | HR 91 | Temp 97.2°F | Resp 17 | Wt 153.2 lb

## 2024-03-31 DIAGNOSIS — Z79899 Other long term (current) drug therapy: Secondary | ICD-10-CM | POA: Insufficient documentation

## 2024-03-31 DIAGNOSIS — E1165 Type 2 diabetes mellitus with hyperglycemia: Secondary | ICD-10-CM

## 2024-03-31 DIAGNOSIS — N1831 Chronic kidney disease, stage 3a: Secondary | ICD-10-CM | POA: Insufficient documentation

## 2024-03-31 DIAGNOSIS — D72819 Decreased white blood cell count, unspecified: Secondary | ICD-10-CM

## 2024-03-31 DIAGNOSIS — I129 Hypertensive chronic kidney disease with stage 1 through stage 4 chronic kidney disease, or unspecified chronic kidney disease: Secondary | ICD-10-CM | POA: Diagnosis not present

## 2024-03-31 DIAGNOSIS — Z794 Long term (current) use of insulin: Secondary | ICD-10-CM | POA: Diagnosis not present

## 2024-03-31 DIAGNOSIS — E1122 Type 2 diabetes mellitus with diabetic chronic kidney disease: Secondary | ICD-10-CM | POA: Diagnosis not present

## 2024-03-31 DIAGNOSIS — D72828 Other elevated white blood cell count: Secondary | ICD-10-CM | POA: Insufficient documentation

## 2024-03-31 LAB — CMP (CANCER CENTER ONLY)
ALT: 25 U/L (ref 0–44)
AST: 30 U/L (ref 15–41)
Albumin: 4.4 g/dL (ref 3.5–5.0)
Alkaline Phosphatase: 66 U/L (ref 38–126)
Anion gap: 15 (ref 5–15)
BUN: 34 mg/dL — ABNORMAL HIGH (ref 8–23)
CO2: 26 mmol/L (ref 22–32)
Calcium: 10.2 mg/dL (ref 8.9–10.3)
Chloride: 95 mmol/L — ABNORMAL LOW (ref 98–111)
Creatinine: 1.31 mg/dL — ABNORMAL HIGH (ref 0.44–1.00)
GFR, Estimated: 40 mL/min — ABNORMAL LOW
Glucose, Bld: 164 mg/dL — ABNORMAL HIGH (ref 70–99)
Potassium: 4.4 mmol/L (ref 3.5–5.1)
Sodium: 136 mmol/L (ref 135–145)
Total Bilirubin: 0.4 mg/dL (ref 0.0–1.2)
Total Protein: 8 g/dL (ref 6.5–8.1)

## 2024-03-31 LAB — CBC WITH DIFFERENTIAL (CANCER CENTER ONLY)
Abs Immature Granulocytes: 0.36 10*3/uL — ABNORMAL HIGH (ref 0.00–0.07)
Basophils Absolute: 0.2 10*3/uL — ABNORMAL HIGH (ref 0.0–0.1)
Basophils Relative: 1 %
Eosinophils Absolute: 0.7 10*3/uL — ABNORMAL HIGH (ref 0.0–0.5)
Eosinophils Relative: 3 %
HCT: 44.3 % (ref 36.0–46.0)
Hemoglobin: 15.2 g/dL — ABNORMAL HIGH (ref 12.0–15.0)
Immature Granulocytes: 2 %
Lymphocytes Relative: 21 %
Lymphs Abs: 4.2 10*3/uL — ABNORMAL HIGH (ref 0.7–4.0)
MCH: 32.2 pg (ref 26.0–34.0)
MCHC: 34.3 g/dL (ref 30.0–36.0)
MCV: 93.9 fL (ref 80.0–100.0)
Monocytes Absolute: 1.1 10*3/uL — ABNORMAL HIGH (ref 0.1–1.0)
Monocytes Relative: 5 %
Neutro Abs: 13.6 10*3/uL — ABNORMAL HIGH (ref 1.7–7.7)
Neutrophils Relative %: 68 %
Platelet Count: 236 10*3/uL (ref 150–400)
RBC: 4.72 MIL/uL (ref 3.87–5.11)
RDW: 13.2 % (ref 11.5–15.5)
Smear Review: NORMAL
WBC Count: 20.1 10*3/uL — ABNORMAL HIGH (ref 4.0–10.5)
nRBC: 0 % (ref 0.0–0.2)

## 2024-03-31 LAB — LACTATE DEHYDROGENASE: LDH: 281 U/L — ABNORMAL HIGH (ref 105–235)

## 2024-03-31 LAB — SEDIMENTATION RATE: Sed Rate: 5 mm/h (ref 0–22)

## 2024-03-31 NOTE — Assessment & Plan Note (Addendum)
 Lab today. Based on results, further testing may be needed. If borderline persistent neutrophilia, will follow in 3 months and repeat testing again.

## 2024-06-29 ENCOUNTER — Inpatient Hospital Stay
